# Patient Record
Sex: Female | Born: 1960
Health system: Southern US, Community
[De-identification: ages and names within clinical notes are randomized; demographics above are authoritative.]

## PROBLEM LIST (undated history)

## (undated) DIAGNOSIS — R7303 Prediabetes: Secondary | ICD-10-CM

## (undated) DIAGNOSIS — N200 Calculus of kidney: Secondary | ICD-10-CM

## (undated) DIAGNOSIS — N838 Other noninflammatory disorders of ovary, fallopian tube and broad ligament: Secondary | ICD-10-CM

## (undated) DIAGNOSIS — C801 Malignant (primary) neoplasm, unspecified: Secondary | ICD-10-CM

## (undated) HISTORY — PX: APPENDECTOMY: SHX54

## (undated) HISTORY — DX: Prediabetes: R73.03

## (undated) HISTORY — DX: Other noninflammatory disorders of ovary, fallopian tube and broad ligament: N83.8

---

## 1994-07-04 HISTORY — PX: BRAIN SURGERY: SHX531

## 2004-08-24 ENCOUNTER — Emergency Department: Payer: Self-pay | Admitting: Emergency Medicine

## 2010-01-12 ENCOUNTER — Emergency Department (HOSPITAL_COMMUNITY): Admission: EM | Admit: 2010-01-12 | Discharge: 2010-01-12 | Payer: Self-pay | Admitting: Family Medicine

## 2010-01-16 ENCOUNTER — Emergency Department (HOSPITAL_COMMUNITY): Admission: EM | Admit: 2010-01-16 | Discharge: 2010-01-16 | Payer: Self-pay | Admitting: Emergency Medicine

## 2010-03-22 ENCOUNTER — Encounter: Admission: RE | Admit: 2010-03-22 | Discharge: 2010-03-22 | Payer: Self-pay | Admitting: Obstetrics & Gynecology

## 2010-09-18 LAB — URINALYSIS, ROUTINE W REFLEX MICROSCOPIC
Ketones, ur: 15 mg/dL — AB
Protein, ur: NEGATIVE mg/dL
pH: 5 (ref 5.0–8.0)

## 2010-09-19 LAB — POCT I-STAT, CHEM 8
Glucose, Bld: 120 mg/dL — ABNORMAL HIGH (ref 70–99)
Potassium: 4.9 mEq/L (ref 3.5–5.1)
Sodium: 139 mEq/L (ref 135–145)
TCO2: 26 mmol/L (ref 0–100)

## 2010-09-19 LAB — POCT URINALYSIS DIP (DEVICE)
Bilirubin Urine: NEGATIVE
Ketones, ur: NEGATIVE mg/dL
Nitrite: NEGATIVE
Specific Gravity, Urine: >=1.03 (ref 1.005–1.030)

## 2011-08-24 ENCOUNTER — Encounter: Payer: Self-pay | Admitting: *Deleted

## 2011-08-24 ENCOUNTER — Other Ambulatory Visit: Payer: Self-pay | Admitting: Obstetrics and Gynecology

## 2011-08-24 DIAGNOSIS — N838 Other noninflammatory disorders of ovary, fallopian tube and broad ligament: Secondary | ICD-10-CM

## 2011-08-25 ENCOUNTER — Ambulatory Visit
Admission: RE | Admit: 2011-08-25 | Discharge: 2011-08-25 | Disposition: A | Discharge status: Home / Self Care | Payer: 59 | Source: Ambulatory Visit | Attending: Obstetrics and Gynecology | Admitting: Obstetrics and Gynecology

## 2011-08-25 ENCOUNTER — Ambulatory Visit: Payer: 59 | Attending: Gynecologic Oncology | Admitting: Gynecologic Oncology

## 2011-08-25 ENCOUNTER — Encounter: Payer: Self-pay | Admitting: Gynecologic Oncology

## 2011-08-25 VITALS — BP 108/62 | HR 78 | Temp 97.9°F | Resp 18 | Ht 66.77 in | Wt 159.2 lb

## 2011-08-25 DIAGNOSIS — Z9089 Acquired absence of other organs: Secondary | ICD-10-CM | POA: Insufficient documentation

## 2011-08-25 DIAGNOSIS — Z885 Allergy status to narcotic agent status: Secondary | ICD-10-CM | POA: Insufficient documentation

## 2011-08-25 DIAGNOSIS — N9489 Other specified conditions associated with female genital organs and menstrual cycle: Secondary | ICD-10-CM | POA: Insufficient documentation

## 2011-08-25 DIAGNOSIS — N838 Other noninflammatory disorders of ovary, fallopian tube and broad ligament: Secondary | ICD-10-CM

## 2011-08-25 DIAGNOSIS — Z803 Family history of malignant neoplasm of breast: Secondary | ICD-10-CM | POA: Insufficient documentation

## 2011-08-25 MED ORDER — IOHEXOL 300 MG/ML  SOLN
100.0000 mL | Freq: Once | INTRAMUSCULAR | Status: AC | PRN
  Administered 2011-08-25: 100 mL via INTRAVENOUS

## 2011-08-25 NOTE — Patient Instructions (Signed)
preop pending.

## 2011-08-25 NOTE — Progress Notes (Signed)
Consult Note: Gyn-Onc  Jordan Golden 51 y.o. female  CC:  Chief Complaint  Patient presents with  . Ovarian Mass    New patient    HPI: Patient is seen today in consultation at the request of Dr. Tresa Res.  She is a very pleasant 51 year old gravida 4 para 4.  Her last menstrual cycle was in 2005. She had extensive workup for early menopause which was negative. She took Hormone Replacement therapy for 1 month has not taken any since.   She states about 2 weeks ago she was lying in bed she felt a "mobile egg". Last week she felt like it increased in size and was harder, and nonmobile. There was no pain associated with this whatsoever. She was seen in Dr. Harlene Salts office on February 20. Ultrasound revealed a 10.2 x 3.1 x 4.7 cm uterus with endometrial stripe of 2.4 mm. The uterus was displaced by a 10 x 9.6 crit 15.0 cm complex septated cystic mass. There is an irregularly shaped vascular area seen within the upper third of the lumen of this mass which measured 5.3 x 4.2 x 3.5 cm. Neither adnexa was distinctly identified. There is no free fluid present. She had a CA 125 that was 15.2. She had a CT scan of the abdomen and pelvis with contrast this morning. Lung bases and upper abdomen are normal. There was no adenopathy noted. There was a large mass within the mid pelvis largely cystic, but the internal soft tissue components. The mass measures 16.0 x 9.7 x 12.0 cm. The mass displaced the uterus slightly to the right recent question of it arising from the left adnexa. Is a multi-fibroid uterus present. There is no free fluid. The colon and small bowel were normal.  Interval History:   Review of Systems: She denies any pain, early satiety, nausea, vomiting or change in her bowel or bladder habits. She denies any vasomotor symptoms. 10 point review of systems is negative  Current Meds:  Outpatient Encounter Prescriptions as of 08/25/2011  Medication Sig Dispense Refill  . CALCIUM-VITAMIN D PO Take  1 tablet by mouth daily.      . Multiple Vitamins-Minerals (MULTIVITAMIN WITH MINERALS) tablet Take 1 tablet by mouth daily.      Marland Kitchen VITAMIN D, CHOLECALCIFEROL, PO Take 1 tablet by mouth once a week.       Facility-Administered Encounter Medications as of 08/25/2011  Medication Dose Route Frequency Provider Last Rate Last Dose  . iohexol (OMNIPAQUE) 300 MG/ML solution 100 mL  100 mL Intravenous Once PRN Medication Radiologist, MD   100 mL at 08/25/11 0846    Allergy:  Allergies  Allergen Reactions  . Ultram (Tramadol Hcl) Nausea And Vomiting    "Long time ago", "passed out and threw up"    Social Hx:  She has 4 children a son age 53, a daughter age 82, a son age 49, a daughter age 63 History   Social History  . Marital Status: Married    Spouse Name: N/A    Number of Children: N/A  . Years of Education: N/A   Occupational History  . Not on file.   Social History Main Topics  . Smoking status: Never Smoker   . Smokeless tobacco: Not on file  . Alcohol Use: No  . Drug Use: No  . Sexually Active: Not on file   Other Topics Concern  . Not on file   Social History Narrative  . No narrative on file    Past Surgical  Hx:  Past Surgical History  Procedure Date  . Brain surgery 1996    Due to brain injury  . Cesarean section 1995  . Appendectomy     Past Medical Hx: She is due for a mammogram. Her Pap smear in June of this past year that was negative Past Medical History  Diagnosis Date  . Ovarian mass     Family Hx: Her mother was diagnosed with breast cancer at the age of 89 and died at age of 83. She did not have any genetic testing. Her father died of heart disease at the age of 21. Has a sister who died of juvenile diabetes at the age of 66. Family History  Problem Relation Age of Onset  . Breast cancer Mother   . Heart disease Father   . Diabetes Sister     Vitals:  Blood pressure 108/62, pulse 78, temperature 97.9 F (36.6 C), temperature source Oral,  resp. rate 18, height 5' 6.77" (1.696 m), weight 159 lb 3.2 oz (72.213 kg).  Physical Exam: Well-nourished well-developed female in no acute distress.  Neck: Supple no lymphadenopathy no thyromegaly.  Lungs: Clear to auscultation bilaterally.  Cardiovascular: Regular rate and rhythm.  Abdomen: Soft, there is a reducible umbilical hernia. There is a well-healed supraumbilical incision. She has a well healed low transverse incision. There is a 16 cm mass palpable in the lower abdomen. Is nontender. There is no fluid wave. The mass is mobile.  Groins: No lymphadenopathy.  Extremities: No edema, 2+ distal pulses.  Pelvic: Normal external female genitalia. The cervix is palpably normal. There was a large partially 18 cm masses freely mobile encompassing the adnexa. Difficult to ascertain in the left or right adnexa. The uterus is not markedly enlarged. There is no nodularity.  Assessment/Plan: 51 year old pleasant female with a complex 16 cm adnexal mass arising in the setting of no evidence of extraovarian disease, a normal CA 125, and a significant family history for mother with breast cancer at the age of 55. I recommended surgical removal. She was offered several dates including March 1 at [facility], March 5 in [place], and March 8 at [facility]. Due to family obligations she believes that she may have to defer her surgery March 19 in [place]. She will speak with her family determine if other arrangements can be made she is to be taking care of her 10 year old great niece.  We discussed removal of the enlarged adnexa. We'll be sent for frozen section. We also discussed whether or not she would like to have her contralateral ovary removed as well as the uterus. In the setting of being menopausal since 2005 I would at least remove the contralateral ovary if she did not wish to have a hysterectomy. She and I discussed this. If this is a malignant process she understands hysterectomy with  removal of the contralateral ovary inappropriate staging would need to be performed. Should this be a malignancy, I would recommend genetic testing. The risks and benefits of surgery were discussed with the patient. Her questions were elicited in answer to her satisfaction. She has my business card as well as that of Telford Nab.  She will speak with her family tonight and let us know what she would like to do in terms of scheduling her surgery.  Minka Knight A., MD 08/25/2011, 3:52 PM

## 2011-09-06 ENCOUNTER — Encounter: Payer: Self-pay | Admitting: Gynecologic Oncology

## 2011-09-06 ENCOUNTER — Ambulatory Visit: Payer: 59 | Attending: Gynecologic Oncology | Admitting: Gynecologic Oncology

## 2011-09-06 VITALS — BP 100/62 | HR 72 | Temp 98.2°F | Resp 16 | Ht 66.0 in | Wt 156.9 lb

## 2011-09-06 DIAGNOSIS — N838 Other noninflammatory disorders of ovary, fallopian tube and broad ligament: Secondary | ICD-10-CM

## 2011-09-06 DIAGNOSIS — N9489 Other specified conditions associated with female genital organs and menstrual cycle: Secondary | ICD-10-CM | POA: Insufficient documentation

## 2011-09-06 DIAGNOSIS — Z803 Family history of malignant neoplasm of breast: Secondary | ICD-10-CM | POA: Insufficient documentation

## 2011-09-06 NOTE — Progress Notes (Signed)
Pre Op Note: Gyn-Onc  Jordan Golden 51 y.o. female  CC:  Chief Complaint  Patient presents with  . Ovarian mass    Follow up    HPI: This is a  51 year old gravida 4 para 4.  Her last menstrual cycle was in 2005. She had extensive workup for early menopause which was negative. She took Hormone Replacement therapy for 1 month has not taken any since.   She states about 2 weeks ago she was lying in bed she felt a "mobile egg". Last week she felt like it increased in size and was harder, and nonmobile. There was no pain associated with this whatsoever. She was seen in Dr. Harlene Salts office on February 20. Ultrasound revealed a 10.2 x 3.1 x 4.7 cm uterus with endometrial stripe of 2.4 mm. The uterus was displaced by a 10 x 9.6 crit 15.0 cm complex septated cystic mass. There is an irregularly shaped vascular area seen within the upper third of the lumen of this mass which measured 5.3 x 4.2 x 3.5 cm. Neither adnexa was distinctly identified. There is no free fluid present. She had a CA 125 that was 15.2. She had a CT scan of the abdomen and pelvis with contrast this morning. Lung bases and upper abdomen are normal. There was no adenopathy noted. There was a large mass within the mid pelvis largely cystic, but the internal soft tissue components. The mass measures 16.0 x 9.7 x 12.0 cm. The mass displaced the uterus slightly to the right recent question of it arising from the left adnexa. Is a multi-fibroid uterus present. There is no free fluid. The colon and small bowel were normal.  Interval History: The patient is consider surgical options and today presents to share with her as her surgical preferences.  Review of Systems: She denies any pain, early satiety, nausea, vomiting or change in her bowel or bladder habits. She denies any vasomotor symptoms. 10 point review of systems is negative.  Patient is worried  Current Meds:  Outpatient Encounter Prescriptions as of 09/06/2011  Medication Sig  Dispense Refill  . CALCIUM-VITAMIN D PO Take 1 tablet by mouth daily.      . Multiple Vitamins-Minerals (MULTIVITAMIN WITH MINERALS) tablet Take 1 tablet by mouth daily.      Marland Kitchen VITAMIN D, CHOLECALCIFEROL, PO Take 1 tablet by mouth once a week.        Allergy:  Allergies  Allergen Reactions  . Ultram (Tramadol Hcl) Nausea And Vomiting    "Long time ago", "passed out and threw up"    Social Hx:  She has 4 children a son age 69, a daughter age 68, a son age 83, a daughter age 58 History   Social History  . Marital Status: Married    Spouse Name: N/A    Number of Children: N/A  . Years of Education: N/A   Occupational History  . Not on file.   Social History Main Topics  . Smoking status: Never Smoker   . Smokeless tobacco: Not on file  . Alcohol Use: No  . Drug Use: No  . Sexually Active: Not on file   Other Topics Concern  . Not on file   Social History Narrative  . No narrative on file    Past Surgical Hx:  Past Surgical History  Procedure Date  . Brain surgery 1996    Due to brain injury  . Cesarean section 1995  . Appendectomy     Past Medical Hx: She  is due for a mammogram. Her Pap smear in June of this past year that was negative Past Medical History  Diagnosis Date  . Ovarian mass     Family Hx: Her mother was diagnosed with breast cancer at the age of 82 and died at age of 74. She did not have any genetic testing. Her father died of heart disease at the age of 31. Has a sister who died of juvenile diabetes at the age of 10. Family History  Problem Relation Age of Onset  . Breast cancer Mother   . Heart disease Father   . Diabetes Sister     Vitals:  Blood pressure 100/62, pulse 72, temperature 98.2 F (36.8 C), resp. rate 16, height 5\' 6"  (1.676 m), weight 156 lb 14.4 oz (71.169 kg).  Physical Exam: Well-nourished well-developed female in no acute distress.  Neck: Supple no lymphadenopathy no thyromegaly.  Lungs: Clear to auscultation  bilaterally.  Cardiovascular: Regular rate and rhythm.  Abdomen: Soft, there is a reducible umbilical hernia. There is a well-healed supraumbilical incision. She has a well healed low transverse incision. There is a 16 cm mass palpable in the lower abdomen. Is nontender. There is no fluid wave. The mass is mobile.  Groins: No lymphadenopathy.  Extremities: No edema, 2+ distal pulses.  Pelvic: Normal external female genitalia. The cervix is palpably normal. There was a large partially 18 cm masses freely mobile encompassing the adnexa. Difficult to ascertain in the left or right adnexa. The uterus is not markedly enlarged. There is no nodularity.  Assessment/Plan: 51 year old pleasant female with a complex 16 cm adnexal mass arising in the setting of no evidence of extraovarian disease, a normal CA 125, and a significant family history for mother with breast cancer at the age of 55. The plan is to proceed with a total abdominal hysterectomy and bilateral salpingo-oophorectomy. The 16 cm adnexal mass will be  sent for frozen section.  If malignancy is identified the plan is to proceed with surgical staging and debulking. The questions of the patient and her husband were answered to their satisfaction. Then decide risks of the procedure include but are not limited to infection bleeding damage to surrounding structures on auscultation reoperation. The planned surgical date for the procedure is 09/20/2011.  Laurette Schimke, MD 09/06/2011, 2:25 PM

## 2011-09-06 NOTE — Patient Instructions (Signed)
TAH BSO 09/20/2011

## 2011-09-10 ENCOUNTER — Encounter (HOSPITAL_COMMUNITY): Payer: Self-pay | Admitting: Nurse Practitioner

## 2011-09-10 ENCOUNTER — Emergency Department (HOSPITAL_COMMUNITY): Payer: 59

## 2011-09-10 ENCOUNTER — Emergency Department (HOSPITAL_COMMUNITY)
Admission: EM | Admit: 2011-09-10 | Discharge: 2011-09-10 | Disposition: A | Discharge status: Home / Self Care | Payer: 59 | Attending: Emergency Medicine | Admitting: Emergency Medicine

## 2011-09-10 DIAGNOSIS — M549 Dorsalgia, unspecified: Secondary | ICD-10-CM | POA: Insufficient documentation

## 2011-09-10 DIAGNOSIS — W108XXA Fall (on) (from) other stairs and steps, initial encounter: Secondary | ICD-10-CM | POA: Insufficient documentation

## 2011-09-10 MED ORDER — IBUPROFEN 800 MG PO TABS: 800.0000 mg | ORAL_TABLET | Freq: Three times a day (TID) | ORAL | Status: AC

## 2011-09-10 MED ORDER — DIAZEPAM 5 MG PO TABS: 5.0000 mg | ORAL_TABLET | Freq: Every day | ORAL | Status: DC

## 2011-09-10 NOTE — ED Notes (Signed)
States fell down stairs at home on Tuesday night, landing on L lower back/flank, continued pain since. Ambulatory, mae

## 2011-09-10 NOTE — Discharge Instructions (Signed)
Back Pain, Adult Low back pain is very common. About 1 in 5 people have back pain.The cause of low back pain is rarely dangerous. The pain often gets better over time.About half of people with a sudden onset of back pain feel better in just 2 weeks. About 8 in 10 people feel better by 6 weeks.  CAUSES Some common causes of back pain include:  Strain of the muscles or ligaments supporting the spine.   Wear and tear (degeneration) of the spinal discs.   Arthritis.   Direct injury to the back.  DIAGNOSIS Most of the time, the direct cause of low back pain is not known.However, back pain can be treated effectively even when the exact cause of the pain is unknown.Answering your caregiver's questions about your overall health and symptoms is one of the most accurate ways to make sure the cause of your pain is not dangerous. If your caregiver needs more information, he or she may order lab work or imaging tests (X-rays or MRIs).However, even if imaging tests show changes in your back, this usually does not require surgery. HOME CARE INSTRUCTIONS For many people, back pain returns.Since low back pain is rarely dangerous, it is often a condition that people can learn to manageon their own.   Remain active. It is stressful on the back to sit or stand in one place. Do not sit, drive, or stand in one place for more than 30 minutes at a time. Take short walks on level surfaces as soon as pain allows.Try to increase the length of time you walk each day.   Do not stay in bed.Resting more than 1 or 2 days can delay your recovery.   Do not avoid exercise or work.Your body is made to move.It is not dangerous to be active, even though your back may hurt.Your back will likely heal faster if you return to being active before your pain is gone.   Pay attention to your body when you bend and lift. Many people have less discomfortwhen lifting if they bend their knees, keep the load close to their  bodies,and avoid twisting. Often, the most comfortable positions are those that put less stress on your recovering back.   Find a comfortable position to sleep. Use a firm mattress and lie on your side with your knees slightly bent. If you lie on your back, put a pillow under your knees.   Only take over-the-counter or prescription medicines as directed by your caregiver. Over-the-counter medicines to reduce pain and inflammation are often the most helpful.Your caregiver may prescribe muscle relaxant drugs.These medicines help dull your pain so you can more quickly return to your normal activities and healthy exercise.   Put ice on the injured area.   Put ice in a plastic bag.   Place a towel between your skin and the bag.   Leave the ice on for 15 to 20 minutes, 3 to 4 times a day for the first 2 to 3 days. After that, ice and heat may be alternated to reduce pain and spasms.   Ask your caregiver about trying back exercises and gentle massage. This may be of some benefit.   Avoid feeling anxious or stressed.Stress increases muscle tension and can worsen back pain.It is important to recognize when you are anxious or stressed and learn ways to manage it.Exercise is a great option.  SEEK MEDICAL CARE IF:  You have pain that is not relieved with rest or medicine.   You have   pain that does not improve in 1 week.   You have new symptoms.   You are generally not feeling well.  SEEK IMMEDIATE MEDICAL CARE IF:   You have pain that radiates from your back into your legs.   You develop new bowel or bladder control problems.   You have unusual weakness or numbness in your arms or legs.   You develop nausea or vomiting.   You develop abdominal pain.   You feel faint.  Document Released: 06/20/2005 Document Revised: 06/09/2011 Document Reviewed: 11/08/2010 [employer] Patient Information 2012 [employer]. 

## 2011-09-10 NOTE — ED Provider Notes (Signed)
History     CSN: [id number]  Arrival date & time 09/10/11  1234   First MD Initiated Contact with Patient 09/10/11 1415      Chief Complaint  Patient presents with  . Fall    (Consider location/radiation/quality/duration/timing/severity/associated sxs/prior treatment) HPI Patient presents with pain about her left inferior back.  She fell at the bottom of a flight of stairs, striking her lower back against the steps.  The accident occurred 5 days ago.  Since onset the pain has been focal, worsening, worse with motion, minimally improved with NSAIDs.  The pain is sharp.  No bowel or bladder changes, no distal extremity dysesthesia or weakness. Past Medical History  Diagnosis Date  . Ovarian mass     Past Surgical History  Procedure Date  . Brain surgery 1996    Due to brain injury  . Cesarean section 1995  . Appendectomy     Family History  Problem Relation Age of Onset  . Breast cancer Mother   . Heart disease Father   . Diabetes Sister     History  Substance Use Topics  . Smoking status: Never Smoker   . Smokeless tobacco: Not on file  . Alcohol Use: No    OB History    Grav Para Term Preterm Abortions TAB SAB Ect Mult Living                  Review of Systems  All other systems reviewed and are negative.    Allergies  Ultram  Home Medications   Current Outpatient Rx  Name Route Sig Dispense Refill  . CALCIUM PO Oral Take 1 tablet by mouth daily.    . MULTI-VITAMIN/MINERALS PO TABS Oral Take 1 tablet by mouth daily.    Marland Kitchen VITAMIN D (CHOLECALCIFEROL) PO Oral Take 1 tablet by mouth once a week. Fridays      BP 117/73  Pulse 72  Temp(Src) 97 F (36.1 C) (Oral)  Resp 18  Ht 5\' 7"  (1.702 m)  Wt 157 lb (71.215 kg)  BMI 24.59 kg/m2  SpO2 100%  Physical Exam  Nursing note and vitals reviewed. Constitutional: She appears well-developed and well-nourished.  HENT:  Head: Normocephalic and atraumatic.  Eyes: Conjunctivae and EOM are normal.    Cardiovascular: Normal rate and normal heart sounds.   Pulmonary/Chest: Effort normal and breath sounds normal. No stridor.  Musculoskeletal:       Arms:   ED Course  Procedures (including critical care time)  Labs Reviewed - No data to display No results found.   No diagnosis found.  X-rays reviewed by me no acute fracture  MDM  This patient presents with persistent pain since a fall several days ago.  On exam she is in no distress with no appreciable deformity nor any distal extremity deficits.  Patient's vital signs are unremarkable.  The patient's x-ray does not demonstrate acute fracture.  I discussed the utility of different pain control measures with the patient.  She was discharged in stable condition with prescriptions for analgesia       Gerhard Munch, MD 09/10/11 1452

## 2011-09-13 ENCOUNTER — Encounter (HOSPITAL_COMMUNITY): Payer: Self-pay | Admitting: Pharmacy Technician

## 2011-09-16 ENCOUNTER — Encounter (HOSPITAL_COMMUNITY)
Admission: RE | Admit: 2011-09-16 | Discharge: 2011-09-16 | Disposition: A | Discharge status: Home / Self Care | Payer: 59 | Source: Ambulatory Visit | Attending: Obstetrics & Gynecology | Admitting: Obstetrics & Gynecology

## 2011-09-16 ENCOUNTER — Encounter (HOSPITAL_COMMUNITY): Payer: Self-pay

## 2011-09-16 HISTORY — DX: Calculus of kidney: N20.0

## 2011-09-16 LAB — DIFFERENTIAL
Lymphocytes Relative: 32 % (ref 12–46)
Lymphs Abs: 1.8 10*3/uL (ref 0.7–4.0)
Monocytes Absolute: 0.4 10*3/uL (ref 0.1–1.0)
Monocytes Relative: 7 % (ref 3–12)
Neutro Abs: 3.2 10*3/uL (ref 1.7–7.7)
Neutrophils Relative %: 57 % (ref 43–77)

## 2011-09-16 LAB — COMPREHENSIVE METABOLIC PANEL
AST: 17 U/L (ref 0–37)
Albumin: 4.1 g/dL (ref 3.5–5.2)
BUN: 20 mg/dL (ref 6–23)
Chloride: 104 mEq/L (ref 96–112)
Creatinine, Ser: 0.73 mg/dL (ref 0.50–1.10)
Total Bilirubin: 0.2 mg/dL — ABNORMAL LOW (ref 0.3–1.2)
Total Protein: 6.6 g/dL (ref 6.0–8.3)

## 2011-09-16 LAB — CBC
MCHC: 34.3 g/dL (ref 30.0–36.0)
MCV: 89.6 fL (ref 78.0–100.0)
Platelets: 292 10*3/uL (ref 150–400)
RDW: 13.7 % (ref 11.5–15.5)
WBC: 5.6 10*3/uL (ref 4.0–10.5)

## 2011-09-16 LAB — SURGICAL PCR SCREEN: Staphylococcus aureus: NEGATIVE

## 2011-09-16 NOTE — Patient Instructions (Addendum)
20 Jordan Golden  09/16/2011   Your procedure is scheduled on:  09/20/11  Report to SHORT STAY DEPT  at 8:30 AM.  Call this number if you have problems the morning of surgery: [phone]   Remember:   Do not eat food or drink liquids AFTER MIDNIGHT  May have clear liquids UNTIL 6 HOURS BEFORE SURGERY  Clear liquids include soda, tea, black coffee, apple or grape juice, broth.  Take these medicines the morning of surgery with A SIP OF WATER: NONE   Do not wear jewelry, make-up or nail polish.  Do not wear lotions, powders, or perfumes.   Do not shave legs or underarms 48 hrs. before surgery (men may shave face)  Do not bring valuables to the hospital.  Contacts, dentures or bridgework may not be worn into surgery.  Leave suitcase in the car. After surgery it may be brought to your room.  For patients admitted to the hospital, checkout time is 11:00 AM the day of discharge.   Patients discharged the day of surgery will not be allowed to drive home.  Name and phone number of your driver:   Special Instructions:   Please read over the following fact sheets that you were given: MRSA  Information / Incdentive Spirometer               SHOWER WITH BETASEPT THE NIGHT BEFORE SURGERY AND THE MORNING OF SURGERY

## 2011-09-16 NOTE — Pre-Procedure Instructions (Signed)
UA PREG NOT DONE - PT STATES HAS BEEN POSTMENAPAUSAL  X 8 YRS.

## 2011-09-19 NOTE — Pre-Procedure Instructions (Signed)
PT NOTIFIED SURGERY TIME HAS MOVED UP TO 7:15 AM AND SHE SHOULD ARRIVE TO SHORT STAY BY 5:15 AM--REMINDED NPO AFTER MIDNIGHT.

## 2011-09-20 ENCOUNTER — Ambulatory Visit (HOSPITAL_COMMUNITY): Payer: 59 | Admitting: Certified Registered Nurse Anesthetist

## 2011-09-20 ENCOUNTER — Encounter (HOSPITAL_COMMUNITY)
Admission: RE | Disposition: A | Discharge status: Home / Self Care | Payer: Self-pay | Source: Ambulatory Visit | Attending: Obstetrics & Gynecology

## 2011-09-20 ENCOUNTER — Encounter (HOSPITAL_COMMUNITY): Payer: Self-pay | Admitting: Certified Registered Nurse Anesthetist

## 2011-09-20 ENCOUNTER — Inpatient Hospital Stay (HOSPITAL_COMMUNITY)
Admission: RE | Admit: 2011-09-20 | Discharge: 2011-09-22 | DRG: 738 | Disposition: A | Discharge status: Home / Self Care | Payer: 59 | Source: Ambulatory Visit | Attending: Obstetrics & Gynecology | Admitting: Obstetrics & Gynecology

## 2011-09-20 ENCOUNTER — Encounter (HOSPITAL_COMMUNITY): Payer: Self-pay

## 2011-09-20 DIAGNOSIS — C569 Malignant neoplasm of unspecified ovary: Principal | ICD-10-CM | POA: Diagnosis present

## 2011-09-20 DIAGNOSIS — N838 Other noninflammatory disorders of ovary, fallopian tube and broad ligament: Secondary | ICD-10-CM

## 2011-09-20 DIAGNOSIS — Z01812 Encounter for preprocedural laboratory examination: Secondary | ICD-10-CM

## 2011-09-20 HISTORY — PX: LAPAROTOMY: SHX154

## 2011-09-20 HISTORY — PX: SALPINGOOPHORECTOMY: SHX82

## 2011-09-20 HISTORY — PX: ABDOMINAL HYSTERECTOMY: SHX81

## 2011-09-20 LAB — TYPE AND SCREEN: Antibody Screen: NEGATIVE

## 2011-09-20 LAB — ABO/RH: ABO/RH(D): O POS

## 2011-09-20 SURGERY — LAPAROTOMY, EXPLORATORY
Anesthesia: General | Wound class: Clean Contaminated

## 2011-09-20 MED ORDER — KETOROLAC TROMETHAMINE 30 MG/ML IJ SOLN
INTRAMUSCULAR | Status: AC
  Filled 2011-09-20: qty 1

## 2011-09-20 MED ORDER — KCL IN DEXTROSE-NACL 20-5-0.45 MEQ/L-%-% IV SOLN
INTRAVENOUS | Status: DC
  Administered 2011-09-20 (×2): via INTRAVENOUS
  Administered 2011-09-20: 1000 mL via INTRAVENOUS
  Administered 2011-09-21: 02:00:00 via INTRAVENOUS
  Filled 2011-09-20 (×5): qty 1000

## 2011-09-20 MED ORDER — KCL IN DEXTROSE-NACL 20-5-0.45 MEQ/L-%-% IV SOLN
INTRAVENOUS | Status: AC
  Administered 2011-09-20: 1000 mL via INTRAVENOUS
  Filled 2011-09-20: qty 1000

## 2011-09-20 MED ORDER — NALOXONE HCL 0.4 MG/ML IJ SOLN: 0.4000 mg | INTRAMUSCULAR | Status: DC | PRN

## 2011-09-20 MED ORDER — HYDROMORPHONE 0.3 MG/ML IV SOLN
INTRAVENOUS | Status: DC
  Administered 2011-09-20: 0.6 mg via INTRAVENOUS
  Administered 2011-09-20: 0.3 mg via INTRAVENOUS
  Administered 2011-09-21 (×2): 1.2 mg via INTRAVENOUS
  Administered 2011-09-21: 0.3 mg via INTRAVENOUS

## 2011-09-20 MED ORDER — FENTANYL CITRATE 0.05 MG/ML IJ SOLN
INTRAMUSCULAR | Status: DC | PRN
  Administered 2011-09-20 (×2): 50 ug via INTRAVENOUS
  Administered 2011-09-20: 100 ug via INTRAVENOUS
  Administered 2011-09-20: 150 ug via INTRAVENOUS

## 2011-09-20 MED ORDER — ACETAMINOPHEN 10 MG/ML IV SOLN
INTRAVENOUS | Status: DC | PRN
  Administered 2011-09-20: 1000 mg via INTRAVENOUS

## 2011-09-20 MED ORDER — KETOROLAC TROMETHAMINE 30 MG/ML IJ SOLN
30.0000 mg | Freq: Four times a day (QID) | INTRAMUSCULAR | Status: AC
  Administered 2011-09-20 (×3): 30 mg via INTRAVENOUS
  Filled 2011-09-20 (×2): qty 1

## 2011-09-20 MED ORDER — HEPARIN SODIUM (PORCINE) 1000 UNIT/ML IJ SOLN
INTRAMUSCULAR | Status: DC | PRN
  Administered 2011-09-20: 1000 [IU] via INTRAVENOUS

## 2011-09-20 MED ORDER — DIPHENHYDRAMINE HCL 50 MG/ML IJ SOLN: 12.5000 mg | Freq: Four times a day (QID) | INTRAMUSCULAR | Status: DC | PRN

## 2011-09-20 MED ORDER — ONDANSETRON HCL 4 MG/2ML IJ SOLN: 4.0000 mg | Freq: Four times a day (QID) | INTRAMUSCULAR | Status: DC | PRN

## 2011-09-20 MED ORDER — HEPARIN SODIUM (PORCINE) 1000 UNIT/ML IJ SOLN
INTRAMUSCULAR | Status: AC
  Filled 2011-09-20: qty 1

## 2011-09-20 MED ORDER — MAGNESIUM HYDROXIDE 400 MG/5ML PO SUSP
30.0000 mL | Freq: Three times a day (TID) | ORAL | Status: AC
  Administered 2011-09-20 – 2011-09-21 (×3): 30 mL via ORAL
  Filled 2011-09-20 (×3): qty 30

## 2011-09-20 MED ORDER — STERILE WATER FOR IRRIGATION IR SOLN
Status: DC | PRN
  Administered 2011-09-20: 1500 mL

## 2011-09-20 MED ORDER — ONDANSETRON HCL 4 MG/2ML IJ SOLN
INTRAMUSCULAR | Status: DC | PRN
  Administered 2011-09-20: 4 mg via INTRAVENOUS

## 2011-09-20 MED ORDER — GLYCOPYRROLATE 0.2 MG/ML IJ SOLN
INTRAMUSCULAR | Status: DC | PRN
  Administered 2011-09-20: .5 mg via INTRAVENOUS

## 2011-09-20 MED ORDER — NEOSTIGMINE METHYLSULFATE 1 MG/ML IJ SOLN
INTRAMUSCULAR | Status: DC | PRN
  Administered 2011-09-20: 2.5 mg via INTRAVENOUS

## 2011-09-20 MED ORDER — HYDROMORPHONE HCL PF 1 MG/ML IJ SOLN
0.2500 mg | INTRAMUSCULAR | Status: DC | PRN
  Administered 2011-09-20 (×2): 0.5 mg via INTRAVENOUS

## 2011-09-20 MED ORDER — ZOLPIDEM TARTRATE 5 MG PO TABS: 5.0000 mg | ORAL_TABLET | Freq: Every evening | ORAL | Status: DC | PRN

## 2011-09-20 MED ORDER — KETOROLAC TROMETHAMINE 30 MG/ML IJ SOLN: 30.0000 mg | Freq: Four times a day (QID) | INTRAMUSCULAR | Status: AC

## 2011-09-20 MED ORDER — LACTATED RINGERS IV SOLN
INTRAVENOUS | Status: DC
  Administered 2011-09-20 (×3): via INTRAVENOUS

## 2011-09-20 MED ORDER — EPHEDRINE SULFATE 50 MG/ML IJ SOLN
INTRAMUSCULAR | Status: DC | PRN
  Administered 2011-09-20 (×2): 5 mg via INTRAVENOUS

## 2011-09-20 MED ORDER — OXYCODONE-ACETAMINOPHEN 5-325 MG PO TABS
1.0000 | ORAL_TABLET | ORAL | Status: DC | PRN
  Administered 2011-09-21: 1 via ORAL
  Administered 2011-09-21: 2 via ORAL
  Administered 2011-09-21: 1 via ORAL
  Administered 2011-09-21 – 2011-09-22 (×4): 2 via ORAL
  Filled 2011-09-20: qty 1
  Filled 2011-09-20 (×5): qty 2
  Filled 2011-09-20: qty 1

## 2011-09-20 MED ORDER — CISATRACURIUM BESYLATE 2 MG/ML IV SOLN
INTRAVENOUS | Status: DC | PRN
  Administered 2011-09-20 (×2): 2 mg via INTRAVENOUS
  Administered 2011-09-20: 6 mg via INTRAVENOUS

## 2011-09-20 MED ORDER — MIDAZOLAM HCL 5 MG/5ML IJ SOLN
INTRAMUSCULAR | Status: DC | PRN
  Administered 2011-09-20 (×2): 1 mg via INTRAVENOUS

## 2011-09-20 MED ORDER — SUCCINYLCHOLINE CHLORIDE 20 MG/ML IJ SOLN
INTRAMUSCULAR | Status: DC | PRN
  Administered 2011-09-20: 100 mg via INTRAVENOUS

## 2011-09-20 MED ORDER — LIDOCAINE HCL 1 % IJ SOLN
INTRAMUSCULAR | Status: DC | PRN
  Administered 2011-09-20: 60 mg via INTRADERMAL

## 2011-09-20 MED ORDER — SODIUM CHLORIDE 0.9 % IJ SOLN: 9.0000 mL | INTRAMUSCULAR | Status: DC | PRN

## 2011-09-20 MED ORDER — HYDROMORPHONE HCL PF 1 MG/ML IJ SOLN
INTRAMUSCULAR | Status: AC
  Filled 2011-09-20: qty 1

## 2011-09-20 MED ORDER — PROPOFOL 10 MG/ML IV BOLUS
INTRAVENOUS | Status: DC | PRN
  Administered 2011-09-20: 120 mg via INTRAVENOUS

## 2011-09-20 MED ORDER — FENTANYL CITRATE 0.05 MG/ML IJ SOLN
INTRAMUSCULAR | Status: AC
  Filled 2011-09-20: qty 2

## 2011-09-20 MED ORDER — DIPHENHYDRAMINE HCL 12.5 MG/5ML PO ELIX: 12.5000 mg | ORAL_SOLUTION | Freq: Four times a day (QID) | ORAL | Status: DC | PRN

## 2011-09-20 MED ORDER — DEXTROSE 5 % IV SOLN
1.0000 g | INTRAVENOUS | Status: AC
  Administered 2011-09-20: 1 g via INTRAVENOUS
  Filled 2011-09-20: qty 1

## 2011-09-20 MED ORDER — HETASTARCH-ELECTROLYTES 6 % IV SOLN
INTRAVENOUS | Status: DC | PRN
  Administered 2011-09-20: 08:00:00 via INTRAVENOUS

## 2011-09-20 MED ORDER — KETOROLAC TROMETHAMINE 15 MG/ML IJ SOLN
INTRAMUSCULAR | Status: AC
  Filled 2011-09-20: qty 1

## 2011-09-20 MED ORDER — ACETAMINOPHEN 10 MG/ML IV SOLN
INTRAVENOUS | Status: AC
  Filled 2011-09-20: qty 100

## 2011-09-20 MED ORDER — ONDANSETRON HCL 4 MG PO TABS: 4.0000 mg | ORAL_TABLET | Freq: Four times a day (QID) | ORAL | Status: DC | PRN

## 2011-09-20 MED ORDER — 0.9 % SODIUM CHLORIDE (POUR BTL) OPTIME
TOPICAL | Status: DC | PRN
  Administered 2011-09-20: 2000 mL

## 2011-09-20 MED ORDER — HYDROMORPHONE 0.3 MG/ML IV SOLN
INTRAVENOUS | Status: AC
  Filled 2011-09-20: qty 25

## 2011-09-20 SURGICAL SUPPLY — 49 items
APPLIER CLIP 11 MED OPEN (CLIP) ×8
ATTRACTOMAT 16X20 MAGNETIC DRP (DRAPES) ×4 IMPLANT
BAG URINE DRAINAGE (UROLOGICAL SUPPLIES) IMPLANT
BENZOIN TINCTURE PRP APPL 2/3 (GAUZE/BANDAGES/DRESSINGS) IMPLANT
BLADE EXTENDED COATED 6.5IN (ELECTRODE) ×4 IMPLANT
CANISTER SUCTION 2500CC (MISCELLANEOUS) ×4 IMPLANT
CHLORAPREP W/TINT 26ML (MISCELLANEOUS) ×4 IMPLANT
CLIP APPLIE 11 MED OPEN (CLIP) ×6 IMPLANT
CLIP TI MEDIUM LARGE 6 (CLIP) IMPLANT
CLOTH BEACON ORANGE TIMEOUT ST (SAFETY) ×4 IMPLANT
COVER SURGICAL LIGHT HANDLE (MISCELLANEOUS) ×4 IMPLANT
DRAPE UTILITY 15X26 (DRAPE) ×4 IMPLANT
DRAPE WARM FLUID 44X44 (DRAPE) ×4 IMPLANT
DRSG TELFA 4X14 ISLAND ADH (GAUZE/BANDAGES/DRESSINGS) ×4 IMPLANT
ELECT REM PT RETURN 9FT ADLT (ELECTROSURGICAL) ×4
ELECTRODE REM PT RTRN 9FT ADLT (ELECTROSURGICAL) ×3 IMPLANT
GAUZE SPONGE 4X4 16PLY XRAY LF (GAUZE/BANDAGES/DRESSINGS) ×4 IMPLANT
GLOVE BIO SURGEON STRL SZ7.5 (GLOVE) ×20 IMPLANT
GLOVE BIOGEL M STRL SZ7.5 (GLOVE) ×12 IMPLANT
GLOVE INDICATOR 8.0 STRL GRN (GLOVE) ×4 IMPLANT
GOWN STRL REIN XL XLG (GOWN DISPOSABLE) ×4 IMPLANT
LIGASURE IMPACT 36 18CM CVD LR (INSTRUMENTS) ×4 IMPLANT
NS IRRIG 1000ML POUR BTL (IV SOLUTION) ×4 IMPLANT
PACK ABDOMINAL WL (CUSTOM PROCEDURE TRAY) ×4 IMPLANT
SHEET LAVH (DRAPES) ×4 IMPLANT
SPONGE GAUZE 4X4 12PLY (GAUZE/BANDAGES/DRESSINGS) ×4 IMPLANT
SPONGE LAP 18X18 X RAY DECT (DISPOSABLE) ×8 IMPLANT
STRIP CLOSURE SKIN 1/2X4 (GAUZE/BANDAGES/DRESSINGS) IMPLANT
SUT ETHILON 1 LR 30 (SUTURE) IMPLANT
SUT PDS AB 0 CT1 36 (SUTURE) ×8 IMPLANT
SUT PDS AB 0 CTX 60 (SUTURE) ×8 IMPLANT
SUT SILK 0 (SUTURE) ×1
SUT SILK 0 30XBRD TIE 6 (SUTURE) ×3 IMPLANT
SUT SILK 2 0 (SUTURE)
SUT SILK 2 0 30  PSL (SUTURE)
SUT SILK 2 0 30 PSL (SUTURE) IMPLANT
SUT SILK 2-0 18XBRD TIE 12 (SUTURE) IMPLANT
SUT VIC AB 0 CT1 36 (SUTURE) ×24 IMPLANT
SUT VIC AB 2-0 CT2 27 (SUTURE) IMPLANT
SUT VIC AB 2-0 SH 27 (SUTURE) ×1
SUT VIC AB 2-0 SH 27X BRD (SUTURE) ×3 IMPLANT
SUT VIC AB 3-0 CTX 36 (SUTURE) IMPLANT
SUT VIC AB 4-0 PS1 27 (SUTURE) ×8 IMPLANT
SUT VICRYL 0 TIES 12 18 (SUTURE) ×4 IMPLANT
TOWEL BLUE STERILE X RAY DET (MISCELLANEOUS) ×4 IMPLANT
TOWEL OR 17X26 10 PK STRL BLUE (TOWEL DISPOSABLE) ×4 IMPLANT
TOWEL OR NON WOVEN STRL DISP B (DISPOSABLE) ×8 IMPLANT
TRAY FOLEY CATH 14FRSI W/METER (CATHETERS) ×4 IMPLANT
WATER STERILE IRR 1500ML POUR (IV SOLUTION) ×4 IMPLANT

## 2011-09-20 NOTE — Op Note (Signed)
Preoperative Diagnosis: 1. Left adnexal mass.  Postoperative Diagnosis:Left t adnexal mass consistent with  adenocarcinoma  of the ovary on frozen section.   Procedure(s) Performed: 1. Exploratory laparotomy with total hysterectomy bilateral salpingo-oophorectomy, pelvic and para-aortic lymph node dissection, infragastric omentectomy washings and biopsies for ovarian cancer .  Surgeon: Laurette Schimke, M.D.   Assistanr Surgeon: Antionette Char, M.D. Assistant: Telford Nab RN  Specimens: Uterus Cervix, Bilateral tubes / ovaries, bilateral pelvic and para-aortic lymph nodes, peritoneal biopsies, washings and omentum.   Estimated Blood Loss: 100 mL. Blood Replacement: None.   Urine Output: 250cc  Complications: None.   Operative Findings: A mobile left ovarian mass,  16 cm mass nonadherence to any surrounding structures. No evidence of intra-abdominal pelvic or peritoneal metastases no intra-peritoneal rupture occurred;  Frozen pathology was consistent with low malignant potential ovarian cancer with a solid nodule of  ovarian adenocarcinoma; left tube and ovary normal in appearance; 3) normal bilateral pelvic and para-aortic lymph nodes and omentum; no evidence of disease on  small and large bowel palpation. This represented an optimal cytoreduction with no gross visible disease remaining. The appendix was surgically absent.  Procedure:   The patient was seen in the Holding Room. The risks, benefits, complications, treatment options, and expected outcomes were discussed with the patient.  The patient concurred with the proposed plan, giving informed consent.   The patient was  identified as Jordan Golden and the procedure verified as hysterectomy, BSO with possible staging. A Time Out was held and the above information confirmed upon entry to the operating room..  After induction of anesthesia, the patient was draped and prepped in the usual sterile manner.  She was prepped and draped in  the normal sterile fashion in the dorsal lithotomy position in padded Allen stirrups with good attention paid to support of the lower back and lower extremities. Position was adjusted for appropriate support. A Foley catheter was placed to gravity.   A midline vertical incision was made and carried through the subcutaneous tissue to the fascia. The fascial incision was made and extended superiorally. The rectus muscles were separated. The peritoneum was identified and entered. Peritoneal incision was extended longitudinally.  The abdominal cavity was entered sharply and without incident. A Bookwalter retractor was then placed. A survey of the abdomen and pelvis revealed the above findings, which were significant for a large left-sided retroperitoneal mass displacing the colon. The left tube and ovary were grossly normal in appearance.  Washings were collected.   After packing the small bowel into the upper abdomen, we began the procedure by entering the  pelvic sidewall just posterior to the left round ligament. The pararectal space was developed and the retroperitoneum developed up to the level of the common iliac artery.  The course of the ureter was identified with ease. The left P was then skeletonized, clamped, cut and suture ligated with 0 Vicryl x 2. The left utero-ovarian ligament was clamped and transected.   The entire left  tube and ovary were then sent to pathology  Kelly clamps were placed on both uterine cornua. The round ligaments were identified and transected with the bovie. The anterior peritoneal reflection was incised and the bladder was dissected off the lower uterine segment. The retroperitoneal space was explored and the ureters were identified bilaterally. The right adnexa were secured.  The right ureter was identified in the retroperitoneum.  The right IP was clamped transected and ligated.  The uterine vessels were skeletonized bilaterally , then clamped,  cut and suture ligated with  0-Vicryl suture. Serial pedicles of the cardinal and utero-sacral ligaments were clamped, cut, and suture ligated with 0-Vicryl. Entrance was made into the vagina and the cervix amputated. The vaginal cuff angle were suture ligated with 0-Vicryl suture. placed incorporating the utero-sacral ligaments for support. The vaginal cuff was then closed in the standard [facility] fashion with 0 PDS suture.   The pelvis was copiously irrigated. Hemostasis was observed.  Peritoneal biopsies of the pelvis abdomen, cul de sac, colic gutters, diaphragm were collected.  A staging procedure was then performed. We further developed the left and right paravesical and pararectal spaces and performed a left-sided pelvic lymph node dissection with the following borders: proximally the bifurcation of the common iliac, distally the circumflex iliac vein, laterally the genitofemoral nerve, the medial border was the superior vesicle artery and the deep border was the obturator nerve. All lymphatic tissue was removed and sent to Pathology. A similar procedure was performed on the contralateral side. Right and left pelvic peritoneal and anterior and posterior pelvic peritoneal biopsies were then performed with the Bovie. The right pelvic gutter was then re-explored after adjustment of the Bookwalter and the line of Toldt incised, deviating the colon medially. A right para-aortic lymph node dissection was then performed with the following borders: distally the common iliac, medially the aorta and IVC, laterally the psoas and the proximal border was just inferior to the renal vasculature. All lymphatic tissue was removed and sent to Pathology.    The patient was noted to have a surgically absent appendix and no infracolic omentum.  An infragastric omentectomy was performed using a Ligasure.  Hemostasis was assured.  The fascia was reapproximated with 0 looped PDS using a total of two sutures. The subcutaneous layer was then irrigated  copiously.  The subcutaneous layer was reapproximated with interrupted 2-0 Vicryl. The subcutaneous layer was then reapproximated with a running 4-0 Monocryl. The patient tolerated the procedure well.   Sponge, lap and needle counts were correct x 2.    The patient had sequential compression devices for VTE prophylaxis and will receive Lovenox postoperatively. The patient was extubated and taken to the recovery room in stable condition.

## 2011-09-20 NOTE — Interval H&P Note (Signed)
History and Physical Interval Note:  09/20/2011 7:02 AM  Jordan Golden  has presented today for surgery, with the diagnosis of pelvic mass  The various methods of treatment have been discussed with the patient and family. After consideration of risks, benefits and other options for treatment, the patient has consented to  Procedure(s) (LRB): EXPLORATORY LAPAROTOMY (N/A) HYSTERECTOMY ABDOMINAL (N/A)  LAPAROTOMY WITH STAGING (N/A) SALPINGO OOPHERECTOMY (Bilateral) as a surgical intervention .  The patients' history has been reviewed, patient examined, no change in status, stable for surgery.  I have reviewed the patients' chart and labs.  Questions were answered to the patient's satisfaction.     [place], [facility]

## 2011-09-20 NOTE — H&P (View-Only) (Signed)
Pre Op Note: Gyn-Onc  Jordan Golden 51 y.o. female  CC:  Chief Complaint  Patient presents with  . Ovarian mass    Follow up    HPI: This is a  51-year-old gravida 4 para 4.  Her last menstrual cycle was in 2005. She had extensive workup for early menopause which was negative. She took Hormone Replacement therapy for 1 month has not taken any since.   She states about 2 weeks ago she was lying in bed she felt a "mobile egg". Last week she felt like it increased in size and was harder, and nonmobile. There was no pain associated with this whatsoever. She was seen in Dr. Romine's office on February 20. Ultrasound revealed a 10.2 x 3.1 x 4.7 cm uterus with endometrial stripe of 2.4 mm. The uterus was displaced by a 10 x 9.6 crit 15.0 cm complex septated cystic mass. There is an irregularly shaped vascular area seen within the upper third of the lumen of this mass which measured 5.3 x 4.2 x 3.5 cm. Neither adnexa was distinctly identified. There is no free fluid present. She had a CA 125 that was 15.2. She had a CT scan of the abdomen and pelvis with contrast this morning. Lung bases and upper abdomen are normal. There was no adenopathy noted. There was a large mass within the mid pelvis largely cystic, but the internal soft tissue components. The mass measures 16.0 x 9.7 x 12.0 cm. The mass displaced the uterus slightly to the right recent question of it arising from the left adnexa. Is a multi-fibroid uterus present. There is no free fluid. The colon and small bowel were normal.  Interval History: The patient is consider surgical options and today presents to share with her as her surgical preferences.  Review of Systems: She denies any pain, early satiety, nausea, vomiting or change in her bowel or bladder habits. She denies any vasomotor symptoms. 10 point review of systems is negative.  Patient is worried  Current Meds:  Outpatient Encounter Prescriptions as of 09/06/2011  Medication Sig  Dispense Refill  . CALCIUM-VITAMIN D PO Take 1 tablet by mouth daily.      . Multiple Vitamins-Minerals (MULTIVITAMIN WITH MINERALS) tablet Take 1 tablet by mouth daily.      . VITAMIN D, CHOLECALCIFEROL, PO Take 1 tablet by mouth once a week.        Allergy:  Allergies  Allergen Reactions  . Ultram (Tramadol Hcl) Nausea And Vomiting    "Long time ago", "passed out and threw up"    Social Hx:  She has 4 children a son age 25, a daughter age 23, a son age 20, a daughter age 18 History   Social History  . Marital Status: Married    Spouse Name: N/A    Number of Children: N/A  . Years of Education: N/A   Occupational History  . Not on file.   Social History Main Topics  . Smoking status: Never Smoker   . Smokeless tobacco: Not on file  . Alcohol Use: No  . Drug Use: No  . Sexually Active: Not on file   Other Topics Concern  . Not on file   Social History Narrative  . No narrative on file    Past Surgical Hx:  Past Surgical History  Procedure Date  . Brain surgery 1996    Due to brain injury  . Cesarean section 1995  . Appendectomy     Past Medical Hx: She   is due for a mammogram. Her Pap smear in June of this past year that was negative Past Medical History  Diagnosis Date  . Ovarian mass     Family Hx: Her mother was diagnosed with breast cancer at the age of 43 and died at age of 57. She did not have any genetic testing. Her father died of heart disease at the age of 84. Has a sister who died of juvenile diabetes at the age of 35. Family History  Problem Relation Age of Onset  . Breast cancer Mother   . Heart disease Father   . Diabetes Sister     Vitals:  Blood pressure 100/62, pulse 72, temperature 98.2 F (36.8 C), resp. rate 16, height 5' 6" (1.676 m), weight 156 lb 14.4 oz (71.169 kg).  Physical Exam: Well-nourished well-developed female in no acute distress.  Neck: Supple no lymphadenopathy no thyromegaly.  Lungs: Clear to auscultation  bilaterally.  Cardiovascular: Regular rate and rhythm.  Abdomen: Soft, there is a reducible umbilical hernia. There is a well-healed supraumbilical incision. She has a well healed low transverse incision. There is a 16 cm mass palpable in the lower abdomen. Is nontender. There is no fluid wave. The mass is mobile.  Groins: No lymphadenopathy.  Extremities: No edema, 2+ distal pulses.  Pelvic: Normal external female genitalia. The cervix is palpably normal. There was a large partially 18 cm masses freely mobile encompassing the adnexa. Difficult to ascertain in the left or right adnexa. The uterus is not markedly enlarged. There is no nodularity.  Assessment/Plan: 51-year-old pleasant female with a complex 16 cm adnexal mass arising in the setting of no evidence of extraovarian disease, a normal CA 125, and a significant family history for mother with breast cancer at the age of 43. The plan is to proceed with a total abdominal hysterectomy and bilateral salpingo-oophorectomy. The 16 cm adnexal mass will be  sent for frozen section.  If malignancy is identified the plan is to proceed with surgical staging and debulking. The questions of the patient and her husband were answered to their satisfaction. Then decide risks of the procedure include but are not limited to infection bleeding damage to surrounding structures on auscultation reoperation. The planned surgical date for the procedure is 09/20/2011.  Abubakar Crispo, MD 09/06/2011, 2:25 PM   

## 2011-09-20 NOTE — Anesthesia Postprocedure Evaluation (Signed)
  Anesthesia Post-op Note  Patient: Jordan Golden  Procedure(s) Performed: Procedure(s) (LRB): EXPLORATORY LAPAROTOMY (N/A) HYSTERECTOMY ABDOMINAL (N/A) LAPAROTOMY WITH STAGING (N/A) SALPINGO OOPHERECTOMY (Bilateral) OMENTECTOMY () LYMPHADENECTOMY ()  Patient Location: PACU  Anesthesia Type: General  Level of Consciousness: oriented and sedated  Airway and Oxygen Therapy: Patient Spontanous Breathing and Patient connected to nasal cannula oxygen  Post-op Pain: mild  Post-op Assessment: Post-op Vital signs reviewed, Patient's Cardiovascular Status Stable, Respiratory Function Stable and Patent Airway  Post-op Vital Signs: stable  Complications: No apparent anesthesia complications

## 2011-09-20 NOTE — Transfer of Care (Signed)
Immediate Anesthesia Transfer of Care Note  Patient: Jordan Golden  Procedure(s) Performed: Procedure(s) (LRB): EXPLORATORY LAPAROTOMY (N/A) HYSTERECTOMY ABDOMINAL (N/A) LAPAROTOMY WITH STAGING (N/A) SALPINGO OOPHERECTOMY (Bilateral) OMENTECTOMY () LYMPHADENECTOMY ()  Patient Location: PACU  Anesthesia Type: General  Level of Consciousness: awake, alert  and oriented  Airway & Oxygen Therapy: Patient Spontanous Breathing and Patient connected to face mask oxygen  Post-op Assessment: Report given to PACU RN  Post vital signs: Reviewed and stable  Complications: No apparent anesthesia complications

## 2011-09-20 NOTE — Anesthesia Procedure Notes (Signed)
Procedure Name: Intubation Date/Time: 09/20/2011 7:30 AM Performed by: Hulan Fess Pre-anesthesia Checklist: Patient identified, Emergency Drugs available, Suction available, Patient being monitored and Timeout performed Patient Re-evaluated:Patient Re-evaluated prior to inductionOxygen Delivery Method: Circle system utilized Preoxygenation: Pre-oxygenation with 100% oxygen Intubation Type: IV induction Ventilation: Mask ventilation without difficulty Laryngoscope Size: Mac and 3 Grade View: Grade I Tube type: Oral Tube size: 8.0 mm Number of attempts: 1 Placement Confirmation: ETT inserted through vocal cords under direct vision and breath sounds checked- equal and bilateral Secured at: 2.5 cm Tube secured with: Tape Dental Injury: Teeth and Oropharynx as per pre-operative assessment

## 2011-09-20 NOTE — Transfer of Care (Signed)
Immediate Anesthesia Transfer of Care Note  Patient: Jordan Golden  Procedure(s) Performed: Procedure(s) (LRB): EXPLORATORY LAPAROTOMY (N/A) HYSTERECTOMY ABDOMINAL (N/A) LAPAROTOMY WITH STAGING (N/A) SALPINGO OOPHERECTOMY (Bilateral) OMENTECTOMY () LYMPHADENECTOMY ()  Patient Location: PACU  Anesthesia Type: General  Level of Consciousness: awake, alert  and oriented  Airway & Oxygen Therapy: Patient Spontanous Breathing and Patient connected to face mask oxygen  Post-op Assessment: Report given to PACU RN  Post vital signs: Reviewed and stable  Complications: No apparent anesthesia complications 

## 2011-09-20 NOTE — Anesthesia Postprocedure Evaluation (Signed)
  Anesthesia Post-op Note  Patient: Jordan Golden  Procedure(s) Performed: Procedure(s) (LRB): EXPLORATORY LAPAROTOMY (N/A) HYSTERECTOMY ABDOMINAL (N/A) LAPAROTOMY WITH STAGING (N/A) SALPINGO OOPHERECTOMY (Bilateral) OMENTECTOMY () LYMPHADENECTOMY ()  Patient Location: PACU  Anesthesia Type: General  Level of Consciousness: oriented and sedated  Airway and Oxygen Therapy: Patient Spontanous Breathing and Patient connected to nasal cannula oxygen  Post-op Pain: mild  Post-op Assessment: Post-op Vital signs reviewed, Patient's Cardiovascular Status Stable, Respiratory Function Stable and Patent Airway  Post-op Vital Signs: stable  Complications: No apparent anesthesia complications 

## 2011-09-20 NOTE — Anesthesia Preprocedure Evaluation (Signed)
Anesthesia Evaluation  Patient identified by MRN, date of birth, ID band Patient awake    Reviewed: Allergy & Precautions, H&P , NPO status , Patient's Chart, lab work & pertinent test results, reviewed documented beta blocker date and time   Airway Mallampati: I TM Distance: >3 FB Neck ROM: Full    Dental  (+) Teeth Intact and Dental Advisory Given   Pulmonary neg pulmonary ROS,  breath sounds clear to auscultation        Cardiovascular negative cardio ROS  Rhythm:Regular Rate:Normal  Denies cardiac symptoms   Neuro/Psych Closed head injury 1996 Denies neurologic deficits negative psych ROS   GI/Hepatic negative GI ROS, Neg liver ROS,   Endo/Other  negative endocrine ROS  Renal/GU negative Renal ROS   Pelvic mass    Musculoskeletal negative musculoskeletal ROS (+)   Abdominal   Peds negative pediatric ROS (+)  Hematology negative hematology ROS (+)   Anesthesia Other Findings   Reproductive/Obstetrics negative OB ROS                           Anesthesia Physical Anesthesia Plan  ASA: II  Anesthesia Plan: General   Post-op Pain Management:    Induction: Intravenous  Airway Management Planned: Oral ETT  Additional Equipment:   Intra-op Plan:   Post-operative Plan: Extubation in OR  Informed Consent: I have reviewed the patients History and Physical, chart, labs and discussed the procedure including the risks, benefits and alternatives for the proposed anesthesia with the patient or authorized representative who has indicated his/her understanding and acceptance.   Dental advisory given  Plan Discussed with: CRNA and Surgeon  Anesthesia Plan Comments:         Anesthesia Quick Evaluation

## 2011-09-21 LAB — CBC
HCT: 30.6 % — ABNORMAL LOW (ref 36.0–46.0)
Hemoglobin: 10.4 g/dL — ABNORMAL LOW (ref 12.0–15.0)
RBC: 3.4 MIL/uL — ABNORMAL LOW (ref 3.87–5.11)

## 2011-09-21 LAB — BASIC METABOLIC PANEL
BUN: 6 mg/dL (ref 6–23)
CO2: 25 mEq/L (ref 19–32)
Chloride: 108 mEq/L (ref 96–112)
GFR calc non Af Amer: >90 mL/min (ref >90)
Glucose, Bld: 125 mg/dL — ABNORMAL HIGH (ref 70–99)
Potassium: 4 mEq/L (ref 3.5–5.1)
Sodium: 137 mEq/L (ref 135–145)

## 2011-09-21 MED ORDER — ENOXAPARIN (LOVENOX) PATIENT EDUCATION KIT
PACK | Freq: Once | Status: AC
  Administered 2011-09-21: 11:00:00
  Filled 2011-09-21: qty 1

## 2011-09-21 MED ORDER — ENOXAPARIN SODIUM 40 MG/0.4ML ~~LOC~~ SOLN
40.0000 mg | SUBCUTANEOUS | Status: DC
  Administered 2011-09-21 – 2011-09-22 (×2): 40 mg via SUBCUTANEOUS
  Filled 2011-09-21 (×2): qty 0.4

## 2011-09-21 NOTE — Progress Notes (Signed)
Patient ID: Jordan Golden, female   DOB: 1961-05-10, 51 y.o.   MRN: [id number] 1 Day Post-Op Procedure(s) (LRB): EXPLORATORY LAPAROTOMY (N/A) HYSTERECTOMY ABDOMINAL (N/A) LAPAROTOMY WITH STAGING (N/A) SALPINGO OOPHERECTOMY (Bilateral) OMENTECTOMY () LYMPHADENECTOMY ()  Subjective: Patient reports no complaints.    Objective: Vital signs in last 24 hours: Temp:  [97.3 F (36.3 C)-99 F (37.2 C)] 98.6 F (37 C) (03/20 0617) Pulse Rate:  [61-86] 77  (03/20 0617) Resp:  [11-20] 18  (03/20 0738) BP: (89-134)/(51-83) 99/62 mmHg (03/20 0617) SpO2:  [99 %-100 %] 100 % (03/20 0617) Weight:  [71.215 kg (157 lb)] 71.215 kg (157 lb) (03/19 1225) Last BM Date: 09/19/11  Intake/Output from previous day: 03/19 0701 - 03/20 0700 In: 2495 [P.O.:120; I.V.:2375] Out: 5025 [Urine:4925; Blood:100]  Physical Examination: General: alert GI: soft, non-tender; bowel sounds normal; no masses,  no organomegaly and incision: clean, dry and intact Extremities: extremities normal, atraumatic, no cyanosis or edema Vaginal Bleeding: none  Labs: WBC/Hgb/Hct/Plts:  6.6/10.4/30.6/242 (03/20 0439) BUN/Cr/glu/ALT/AST/amyl/lip:  6/0.72/--/--/--/--/-- (03/20 0439)   Assessment:  51 y.o. s/p Procedure(s): EXPLORATORY LAPAROTOMY HYSTERECTOMY ABDOMINAL LAPAROTOMY WITH STAGING SALPINGO OOPHERECTOMY OMENTECTOMY LYMPHADENECTOMY: progressing well Pain:  Pain is well-controlled on PCA.  Heme:Anemia: Mild/stable  GI:  Tolerating po: Yes     Prophylaxis: pharmacologic prophylaxis (with any of the following: enoxaparin (Lovenox) 40mg  SQ 2 hours prior to surgery then every day) and intermittent pneumatic compression boots.  Plan: Advance diet Encourage ambulation Discontinue IV fluids   LOS: 1 day    JACKSON-MOORE,Damier Disano A 09/21/2011, 9:33 AM

## 2011-09-22 MED ORDER — OXYCODONE-ACETAMINOPHEN 5-325 MG PO TABS: 1.0000 | ORAL_TABLET | Freq: Four times a day (QID) | ORAL | Status: AC | PRN

## 2011-09-22 MED ORDER — ENOXAPARIN SODIUM 40 MG/0.4ML ~~LOC~~ SOLN: 40.0000 mg | Freq: Every day | SUBCUTANEOUS | Status: DC

## 2011-09-22 MED ORDER — BISACODYL 10 MG RE SUPP
10.0000 mg | Freq: Every morning | RECTAL | Status: DC
  Administered 2011-09-22: 10 mg via RECTAL
  Filled 2011-09-22: qty 1

## 2011-09-22 NOTE — Progress Notes (Signed)
Patient is alert and oriented, vital signs are stable, discharge instructions reviewed with patient and patient's spouse, questions and concerns answered, prescriptions given, patient education on lovenox injections, pt tolerating regular diet and oral pain medication.  Means, Castor Gittleman N 09-22-11 15:02pm

## 2011-09-22 NOTE — Discharge Summary (Signed)
  Physician Discharge Summary  Patient ID: Jordan Golden MRN: [id number] DOB/AGE: 07-31-1960 51 y.o.  Admit date: 09/20/2011 Discharge date: 09/22/2011  Admission Diagnoses: Ovarian mass Discharge Diagnoses: Ovarian Cancer  Discharged Condition: good  Hospital Course: The patient underwent a staging procedure for ovarian cancer.  The postoperative course was uneventful.  She was discharged to home tolerating a regular diet.  Consults: None  Significant Diagnostic Studies: None  Treatments: surgery: See above  Discharge Exam: Blood pressure 115/72, pulse 88, temperature 99 F (37.2 C), temperature source Oral, resp. rate 18, height 5\' 6"  (1.676 m), weight 71.215 kg (157 lb), SpO2 99.00%. General appearance: alert and cooperative GI: soft, non-tender; bowel sounds normal; no masses,  no organomegaly Extremities: extremities normal, atraumatic, no cyanosis or edema Incision/Wound: C/D/I  Disposition: 01-Home or Self Care  Discharge Orders    Future Orders Please Complete By Expires   Diet - low sodium heart healthy      Increase activity slowly      May walk up steps      May shower / Bathe      Comments:   No tub baths for 6 weeks   Driving Restrictions      Comments:   No driving for 1 weeks   Lifting restrictions      Comments:   No lifting > 30 lbs for 6 weeks   Sexual Activity Restrictions      Comments:   No intercourse for 6 weeks   Discharge wound care:      Comments:   Keep clean and dry   Call MD for:  temperature >100.4      Call MD for:  persistant nausea and vomiting      Call MD for:  severe uncontrolled pain      Call MD for:  redness, tenderness, or signs of infection (pain, swelling, redness, odor or green/yellow discharge around incision site)      Call MD for:  persistant dizziness or light-headedness      Call MD for:  extreme fatigue        Medication List  As of 09/22/2011 10:03 AM   TAKE these medications         enoxaparin 40 MG/0.4ML  injection   Commonly known as: LOVENOX   Inject 0.4 mLs (40 mg total) into the skin daily.      multivitamin with minerals tablet   Take 1 tablet by mouth every morning.      omega-3 acid ethyl esters 1 G capsule   Commonly known as: LOVAZA   Take 2 g by mouth every morning.      oxyCODONE-acetaminophen 5-325 MG per tablet   Commonly known as: PERCOCET   Take 1-2 tablets by mouth every 6 (six) hours as needed (moderate to severe pain (when tolerating fluids)).      Vitamin D (Ergocalciferol) 50000 UNITS Caps   Commonly known as: DRISDOL   Take 50,000 Units by mouth every 7 (seven) days. On fridays           Follow-up Information    Call to follow up. (in two days to Telford Nab [phone])          Signed: Antionette Char A 09/22/2011, 10:03 AM

## 2011-09-22 NOTE — Progress Notes (Signed)
Patient ID: Jordan Golden, female   DOB: 08-06-1960, 51 y.o.   MRN: [id number] 2 Days Post-Op Procedure(s) (LRB): EXPLORATORY LAPAROTOMY (N/A) HYSTERECTOMY ABDOMINAL (N/A) LAPAROTOMY WITH STAGING (N/A) SALPINGO OOPHERECTOMY (Bilateral) OMENTECTOMY () LYMPHADENECTOMY ()  Subjective: Patient reports no flatus.  C/O incisional pain.  Objective: Vital signs in last 24 hours: Temp:  [98.4 F (36.9 C)-99 F (37.2 C)] 99 F (37.2 C) (03/21 0442) Pulse Rate:  [77-93] 88  (03/21 0442) Resp:  [18] 18  (03/21 0442) BP: (94-115)/(60-74) 115/72 mmHg (03/21 0442) SpO2:  [96 %-100 %] 99 % (03/21 0442) Last BM Date: 09/19/11  Intake/Output from previous day: 03/20 0701 - 03/21 0700 In: 480 [P.O.:480] Out: 1650 [Urine:1650]  Physical Examination: General: alert GI: soft, non-tender; bowel sounds normal; no masses,  no organomegaly and incision: clean, dry and intact Extremities: extremities normal, atraumatic, no cyanosis or edema Vaginal Bleeding: none  Labs:       Assessment:  51 y.o. s/p Procedure(s): EXPLORATORY LAPAROTOMY HYSTERECTOMY ABDOMINAL LAPAROTOMY WITH STAGING SALPINGO OOPHERECTOMY OMENTECTOMY LYMPHADENECTOMY: progressing well Pain:  Pain is well-controlled on oral meds.  GI:  Tolerating po: Yes     Prophylaxis: pharmacologic prophylaxis (with any of the following: enoxaparin (Lovenox) 40mg  SQ 2 hours prior to surgery then every day) and intermittent pneumatic compression boots.  Plan: Encourage ambulation Dulcolax supp   LOS: 2 days    JACKSON-MOORE,Vanshika Jastrzebski A 09/22/2011, 9:28 AM

## 2011-09-22 NOTE — Discharge Instructions (Signed)
09/22/2011  Return to work: 6 weeks  Activity: 1. Be up and out of the bed during the day.  Take a nap if needed.  You may walk up steps but be careful and use the hand rail.  Stair climbing will tire you more than you think, you may need to stop part way and rest.   2. No lifting or straining for 6 weeks.  3. No driving for 2 weeks.  Do Not drive if you are taking narcotic pain medicine.  4. Shower daily.  Use soap and water on your incision and pat dry; don't rub.   5. No sexual activity and nothing in the vagina for 6 weeks.  Diet: 1. Low sodium Heart Healthy Diet is recommended.  2. It is safe to use a laxative if you have difficulty moving your bowels.   Wound Care: 1. Keep clean and dry.  Shower daily.  Reasons to call the Doctor:   Fever - Oral temperature greater than 100.4 degrees Fahrenheit  Foul-smelling vaginal discharge  Difficulty urinating  Nausea and vomiting  Increased pain at the site of the incision that is unrelieved with pain medicine.  Difficulty breathing with or without chest pain  New calf pain especially if only on one side  Sudden, continuing increased vaginal bleeding with or without clots.   Contacts: For questions or concerns you should contact:  Dr. Antionette Char at [phone]  Dr. Laurette Schimke at [facility] [phone]

## 2011-09-27 ENCOUNTER — Ambulatory Visit: Payer: 59 | Attending: Gynecologic Oncology | Admitting: Gynecologic Oncology

## 2011-09-27 ENCOUNTER — Encounter: Payer: Self-pay | Admitting: Gynecologic Oncology

## 2011-09-27 VITALS — BP 102/70 | HR 62 | Temp 98.3°F | Resp 16 | Ht 66.0 in | Wt 152.9 lb

## 2011-09-27 DIAGNOSIS — Z833 Family history of diabetes mellitus: Secondary | ICD-10-CM | POA: Insufficient documentation

## 2011-09-27 DIAGNOSIS — Z7902 Long term (current) use of antithrombotics/antiplatelets: Secondary | ICD-10-CM | POA: Insufficient documentation

## 2011-09-27 DIAGNOSIS — Z8249 Family history of ischemic heart disease and other diseases of the circulatory system: Secondary | ICD-10-CM | POA: Insufficient documentation

## 2011-09-27 DIAGNOSIS — Z803 Family history of malignant neoplasm of breast: Secondary | ICD-10-CM | POA: Insufficient documentation

## 2011-09-27 DIAGNOSIS — C569 Malignant neoplasm of unspecified ovary: Secondary | ICD-10-CM

## 2011-09-27 DIAGNOSIS — Z90710 Acquired absence of both cervix and uterus: Secondary | ICD-10-CM | POA: Insufficient documentation

## 2011-09-27 DIAGNOSIS — M810 Age-related osteoporosis without current pathological fracture: Secondary | ICD-10-CM | POA: Insufficient documentation

## 2011-09-27 DIAGNOSIS — C562 Malignant neoplasm of left ovary: Secondary | ICD-10-CM | POA: Insufficient documentation
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
## 2011-09-27 NOTE — Patient Instructions (Signed)
Postop check with Dr. Duard Brady in 5 weeks Recommend genetic counseling Followup with Dr. Darrold Span for 3 cycles Taxol carboplatin therapy

## 2011-09-28 ENCOUNTER — Encounter (HOSPITAL_COMMUNITY): Payer: Self-pay | Admitting: Gynecologic Oncology

## 2011-09-28 ENCOUNTER — Encounter: Payer: Self-pay | Admitting: *Deleted

## 2011-09-28 NOTE — Progress Notes (Signed)
As per patient, records faxed to Dr Clifton James @ fax # [phone] for second opinion.  Demographics given to Dr Darrold Span for appt for chemo.

## 2011-10-03 ENCOUNTER — Telehealth: Payer: Self-pay | Admitting: Oncology

## 2011-10-03 NOTE — Telephone Encounter (Signed)
S/w the pt and she is aware of her new pt appt with dr Darrold Span on 10/11/2011@9 :30am

## 2011-10-04 ENCOUNTER — Telehealth: Payer: Self-pay | Admitting: Oncology

## 2011-10-04 NOTE — Telephone Encounter (Signed)
Referred by Dr. Laurette Schimke Dx- Ovarian CA

## 2011-10-05 ENCOUNTER — Telehealth: Payer: Self-pay | Admitting: Oncology

## 2011-10-05 NOTE — Telephone Encounter (Signed)
lmonvm adviisng the pt of her chemo educ class appt on 10/06/2011

## 2011-10-05 NOTE — Progress Notes (Signed)
SPOKE WITH MELISSA LEAD SCHEDULER AND TOLD HER THAT CHEMOTHERAPY ED CLASS WAS NOT SET UP AS PER POF 09-30-11.  MELISSA SAID TO GIVE POF BACK TO [facility] TO SCHEDULE CLASS PRIOR TO 10-11-11 APPT. REVIEWED POF WITH CRYSTAL AND SHE WILL CALL PT. WITH AN APPT. DR. Darrold Span WAS REVIEWING THE CHART TODAY AND COULD NOT FIND CLAAS SCHEDULED OR COMPLETED.

## 2011-10-06 ENCOUNTER — Other Ambulatory Visit: Payer: 59

## 2011-10-06 ENCOUNTER — Encounter: Payer: Self-pay | Admitting: *Deleted

## 2011-10-10 ENCOUNTER — Encounter (HOSPITAL_COMMUNITY): Payer: Self-pay | Admitting: Emergency Medicine

## 2011-10-10 ENCOUNTER — Emergency Department (HOSPITAL_COMMUNITY): Payer: No Typology Code available for payment source

## 2011-10-10 ENCOUNTER — Emergency Department (HOSPITAL_COMMUNITY)
Admission: EM | Admit: 2011-10-10 | Discharge: 2011-10-10 | Disposition: A | Discharge status: Home / Self Care | Payer: No Typology Code available for payment source | Attending: Emergency Medicine | Admitting: Emergency Medicine

## 2011-10-10 DIAGNOSIS — Z79899 Other long term (current) drug therapy: Secondary | ICD-10-CM | POA: Insufficient documentation

## 2011-10-10 DIAGNOSIS — M545 Low back pain, unspecified: Secondary | ICD-10-CM | POA: Insufficient documentation

## 2011-10-10 DIAGNOSIS — M549 Dorsalgia, unspecified: Secondary | ICD-10-CM

## 2011-10-10 DIAGNOSIS — M810 Age-related osteoporosis without current pathological fracture: Secondary | ICD-10-CM | POA: Insufficient documentation

## 2011-10-10 MED ORDER — ONDANSETRON HCL 8 MG PO TABS
4.0000 mg | ORAL_TABLET | Freq: Once | ORAL | Status: AC
  Administered 2011-10-10: 4 mg via ORAL
  Filled 2011-10-10: qty 1

## 2011-10-10 MED ORDER — OXYCODONE-ACETAMINOPHEN 5-325 MG PO TABS
1.0000 | ORAL_TABLET | Freq: Once | ORAL | Status: AC
  Administered 2011-10-10: 1 via ORAL
  Filled 2011-10-10: qty 1

## 2011-10-10 MED ORDER — OXYCODONE-ACETAMINOPHEN 5-325 MG PO TABS: 2.0000 | ORAL_TABLET | ORAL | Status: AC | PRN

## 2011-10-10 MED ORDER — MORPHINE SULFATE 4 MG/ML IJ SOLN: 4.0000 mg | Freq: Once | INTRAMUSCULAR | Status: DC

## 2011-10-10 MED ORDER — MORPHINE SULFATE 4 MG/ML IJ SOLN
4.0000 mg | Freq: Once | INTRAMUSCULAR | Status: AC
  Administered 2011-10-10: 4 mg via INTRAMUSCULAR
  Filled 2011-10-10: qty 1

## 2011-10-10 MED ORDER — IBUPROFEN 600 MG PO TABS: 600.0000 mg | ORAL_TABLET | Freq: Four times a day (QID) | ORAL | Status: AC | PRN
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
## 2011-10-10 NOTE — ED Notes (Signed)
Patient transported to CT 
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
## 2011-10-10 NOTE — ED Notes (Signed)
Pt c/o lower back pain. Pt 3 weeks post op from abd hysterectomy with bilateral salpingoophorectomy. Pt states she has seen a chiropractor in the past for lower back pain.
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
## 2011-10-11 ENCOUNTER — Ambulatory Visit: Payer: 59

## 2011-10-11 ENCOUNTER — Ambulatory Visit (HOSPITAL_BASED_OUTPATIENT_CLINIC_OR_DEPARTMENT_OTHER): Payer: 59 | Admitting: Oncology

## 2011-10-11 ENCOUNTER — Encounter: Payer: Self-pay | Admitting: Oncology

## 2011-10-11 ENCOUNTER — Other Ambulatory Visit: Payer: Self-pay | Admitting: *Deleted

## 2011-10-11 ENCOUNTER — Telehealth: Payer: Self-pay | Admitting: Oncology

## 2011-10-11 ENCOUNTER — Other Ambulatory Visit (HOSPITAL_BASED_OUTPATIENT_CLINIC_OR_DEPARTMENT_OTHER): Payer: 59 | Admitting: Lab

## 2011-10-11 VITALS — BP 126/76 | HR 72 | Temp 97.6°F | Ht 67.0 in | Wt 149.4 lb

## 2011-10-11 DIAGNOSIS — Z803 Family history of malignant neoplasm of breast: Secondary | ICD-10-CM

## 2011-10-11 DIAGNOSIS — Z98891 History of uterine scar from previous surgery: Secondary | ICD-10-CM | POA: Insufficient documentation

## 2011-10-11 DIAGNOSIS — C569 Malignant neoplasm of unspecified ovary: Secondary | ICD-10-CM

## 2011-10-11 DIAGNOSIS — Z9049 Acquired absence of other specified parts of digestive tract: Secondary | ICD-10-CM | POA: Insufficient documentation

## 2011-10-11 DIAGNOSIS — S0291XA Unspecified fracture of skull, initial encounter for closed fracture: Secondary | ICD-10-CM

## 2011-10-11 DIAGNOSIS — S22080A Wedge compression fracture of T11-T12 vertebra, initial encounter for closed fracture: Secondary | ICD-10-CM | POA: Insufficient documentation

## 2011-10-11 LAB — COMPREHENSIVE METABOLIC PANEL
BUN: 15 mg/dL (ref 6–23)
CO2: 28 mEq/L (ref 19–32)
Calcium: 9.3 mg/dL (ref 8.4–10.5)
Creatinine, Ser: 0.82 mg/dL (ref 0.50–1.10)
Glucose, Bld: 89 mg/dL (ref 70–99)
Total Bilirubin: 0.5 mg/dL (ref 0.3–1.2)

## 2011-10-11 LAB — CBC WITH DIFFERENTIAL/PLATELET
Basophils Absolute: 0.1 10*3/uL (ref 0.0–0.1)
Eosinophils Absolute: 0.1 10*3/uL (ref 0.0–0.5)
HCT: 36.8 % (ref 34.8–46.6)
LYMPH%: 22.8 % (ref 14.0–49.7)
MCV: 93.5 fL (ref 79.5–101.0)
MONO%: 8.6 % (ref 0.0–14.0)
NEUT#: 5.2 10*3/uL (ref 1.5–6.5)
NEUT%: 65.8 % (ref 38.4–76.8)
Platelets: 346 10*3/uL (ref 145–400)
RBC: 3.94 10*6/uL (ref 3.70–5.45)
nRBC: 0 % (ref 0–0)

## 2011-10-11 LAB — CA 125: CA 125: 12.4 U/mL (ref 0.0–30.2)

## 2011-10-11 MED ORDER — ONDANSETRON HCL 8 MG PO TABS: 8.0000 mg | ORAL_TABLET | Freq: Two times a day (BID) | ORAL | Status: DC | PRN

## 2011-10-11 MED ORDER — DEXAMETHASONE 4 MG PO TABS: 4.0000 mg | ORAL_TABLET | Freq: Two times a day (BID) | ORAL | Status: AC

## 2011-10-11 MED ORDER — LORAZEPAM 0.5 MG PO TABS: 0.5000 mg | ORAL_TABLET | Freq: Four times a day (QID) | ORAL | Status: AC | PRN
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
## 2011-10-11 NOTE — Progress Notes (Signed)
Patient came in today as a new patient,she has [place] healthcare insurance,I explain to her about our financial assistance program and the co-pay assistance program.I did give her an epp application to take home and fill out and return it to me.

## 2011-10-11 NOTE — Patient Instructions (Signed)
Chemotherpy will be Wed 4-17 at 10:00.  The night before chemo ( Tues 4-16) you need to take 5 of the 4 mg decadron (dexamethasone, steroid) tablets (=20 mg total) at 10 pm with food and another 5 tablets with food at 4 am on 4-17.  These doses will be 12 hrs and 6 hrs prior to taxol chemotherapy.  Night of chemo, whether or not you are nauseated, take ativan (lorazepam) at bedtime. This will also make you drowsy.  AM after chemo, whether or not you have nausea, take zofran  (ondansetron).  This will not make you drowsy.  You can call any time if questions or concerns: [phone]

## 2011-10-11 NOTE — Telephone Encounter (Signed)
gv pt appt schedule for April/may. °

## 2011-10-16 ENCOUNTER — Other Ambulatory Visit: Payer: Self-pay | Admitting: Oncology

## 2011-10-19 ENCOUNTER — Ambulatory Visit (HOSPITAL_BASED_OUTPATIENT_CLINIC_OR_DEPARTMENT_OTHER): Payer: 59

## 2011-10-19 VITALS — BP 116/72 | HR 69 | Temp 98.5°F

## 2011-10-19 DIAGNOSIS — Z5111 Encounter for antineoplastic chemotherapy: Secondary | ICD-10-CM

## 2011-10-19 DIAGNOSIS — C569 Malignant neoplasm of unspecified ovary: Secondary | ICD-10-CM

## 2011-10-19 MED ORDER — FAMOTIDINE IN NACL 20-0.9 MG/50ML-% IV SOLN
20.0000 mg | Freq: Once | INTRAVENOUS | Status: AC
  Administered 2011-10-19: 20 mg via INTRAVENOUS

## 2011-10-19 MED ORDER — PACLITAXEL CHEMO INJECTION 300 MG/50ML
175.0000 mg/m2 | Freq: Once | INTRAVENOUS | Status: AC
  Administered 2011-10-19: 312 mg via INTRAVENOUS
  Filled 2011-10-19: qty 52

## 2011-10-19 MED ORDER — DIPHENHYDRAMINE HCL 50 MG/ML IJ SOLN
50.0000 mg | Freq: Once | INTRAMUSCULAR | Status: AC
  Administered 2011-10-19: 50 mg via INTRAVENOUS

## 2011-10-19 MED ORDER — DEXAMETHASONE SODIUM PHOSPHATE 4 MG/ML IJ SOLN
20.0000 mg | Freq: Once | INTRAMUSCULAR | Status: AC
  Administered 2011-10-19: 20 mg via INTRAVENOUS

## 2011-10-19 MED ORDER — SODIUM CHLORIDE 0.9 % IV SOLN
Freq: Once | INTRAVENOUS | Status: AC
  Administered 2011-10-19: 10:00:00 via INTRAVENOUS

## 2011-10-19 MED ORDER — ONDANSETRON 16 MG/50ML IVPB (CHCC)
16.0000 mg | Freq: Once | INTRAVENOUS | Status: AC
  Administered 2011-10-19: 16 mg via INTRAVENOUS

## 2011-10-19 MED ORDER — SODIUM CHLORIDE 0.9 % IV SOLN
574.0000 mg | Freq: Once | INTRAVENOUS | Status: AC
  Administered 2011-10-19: 570 mg via INTRAVENOUS
  Filled 2011-10-19: qty 57

## 2011-10-20 ENCOUNTER — Telehealth: Payer: Self-pay | Admitting: Medical Oncology

## 2011-10-20 NOTE — Telephone Encounter (Signed)
I spoke with pt and she states that she had a good nigh. She has had no nausea or vomiting. She states she was a little drowsey from the pre-meds but other wise good. She does have her nausea medicines and she knows how to call  If any problems.

## 2011-10-26 ENCOUNTER — Other Ambulatory Visit: Payer: Self-pay | Admitting: *Deleted

## 2011-10-26 ENCOUNTER — Ambulatory Visit (HOSPITAL_BASED_OUTPATIENT_CLINIC_OR_DEPARTMENT_OTHER): Payer: No Typology Code available for payment source | Admitting: Oncology

## 2011-10-26 ENCOUNTER — Encounter: Payer: Self-pay | Admitting: Oncology

## 2011-10-26 ENCOUNTER — Other Ambulatory Visit: Payer: No Typology Code available for payment source | Admitting: Lab

## 2011-10-26 ENCOUNTER — Telehealth: Payer: Self-pay | Admitting: Oncology

## 2011-10-26 VITALS — BP 126/86 | HR 77 | Temp 97.6°F | Ht 67.0 in | Wt 144.1 lb

## 2011-10-26 DIAGNOSIS — C569 Malignant neoplasm of unspecified ovary: Secondary | ICD-10-CM

## 2011-10-26 DIAGNOSIS — Z803 Family history of malignant neoplasm of breast: Secondary | ICD-10-CM

## 2011-10-26 LAB — CBC WITH DIFFERENTIAL/PLATELET
BASO%: 1.9 % (ref 0.0–2.0)
Eosinophils Absolute: 0.2 10*3/uL (ref 0.0–0.5)
HCT: 38.7 % (ref 34.8–46.6)
LYMPH%: 43.2 % (ref 14.0–49.7)
MCHC: 33.5 g/dL (ref 31.5–36.0)
MONO#: 0.2 10*3/uL (ref 0.1–0.9)
NEUT#: 2 10*3/uL (ref 1.5–6.5)
NEUT%: 45 % (ref 38.4–76.8)
Platelets: 185 10*3/uL (ref 145–400)
WBC: 4.4 10*3/uL (ref 3.9–10.3)
lymph#: 1.9 10*3/uL (ref 0.9–3.3)
nRBC: 0 % (ref 0–0)

## 2011-10-26 MED ORDER — DEXAMETHASONE 4 MG PO TABS: ORAL_TABLET | ORAL | Status: DC

## 2011-10-26 NOTE — Telephone Encounter (Signed)
appts made and printed for pt aom °
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
## 2011-10-26 NOTE — Patient Instructions (Signed)
Call if very fatigued or other problems before next scheduled blood counts on 11-02-2011  Be sure someone lets you know CBC results when you come to gyn onc appointment on 11-02-2011

## 2011-11-02 ENCOUNTER — Other Ambulatory Visit: Payer: Self-pay | Admitting: Oncology

## 2011-11-02 ENCOUNTER — Ambulatory Visit (HOSPITAL_BASED_OUTPATIENT_CLINIC_OR_DEPARTMENT_OTHER): Payer: 59

## 2011-11-02 ENCOUNTER — Ambulatory Visit: Payer: 59 | Attending: Gynecologic Oncology | Admitting: Gynecologic Oncology

## 2011-11-02 ENCOUNTER — Encounter: Payer: Self-pay | Admitting: Gynecologic Oncology

## 2011-11-02 ENCOUNTER — Telehealth: Payer: Self-pay | Admitting: Oncology

## 2011-11-02 VITALS — BP 102/64 | HR 70 | Temp 98.1°F | Resp 14 | Ht 66.02 in | Wt 149.1 lb

## 2011-11-02 DIAGNOSIS — C569 Malignant neoplasm of unspecified ovary: Secondary | ICD-10-CM | POA: Insufficient documentation

## 2011-11-02 DIAGNOSIS — D702 Other drug-induced agranulocytosis: Secondary | ICD-10-CM

## 2011-11-02 DIAGNOSIS — Z79899 Other long term (current) drug therapy: Secondary | ICD-10-CM | POA: Insufficient documentation

## 2011-11-02 LAB — CBC WITH DIFFERENTIAL/PLATELET
Basophils Absolute: 0.1 10*3/uL (ref 0.0–0.1)
Eosinophils Absolute: 0.1 10*3/uL (ref 0.0–0.5)
HCT: 36 % (ref 34.8–46.6)
HGB: 11.9 g/dL (ref 11.6–15.9)
MCV: 93.7 fL (ref 79.5–101.0)
NEUT#: 0.9 10*3/uL — ABNORMAL LOW (ref 1.5–6.5)
NEUT%: 27.5 % — ABNORMAL LOW (ref 38.4–76.8)
RDW: 14.4 % (ref 11.2–14.5)
lymph#: 1.8 10*3/uL (ref 0.9–3.3)

## 2011-11-02 MED ORDER — FILGRASTIM 300 MCG/0.5ML IJ SOLN
300.0000 ug | Freq: Once | INTRAMUSCULAR | Status: AC
  Administered 2011-11-02: 300 ug via SUBCUTANEOUS
  Filled 2011-11-02: qty 0.5

## 2011-11-02 NOTE — Progress Notes (Signed)
SPOKE WITH MS. [place] AND TOLD HER THAT HER ANC IS 0.9 TODAY. PT. IS 2 WEEKS FROM  1ST TAXOL CARO TREATMENT 10-19-11.  DR. Darrold Span WANTS TO GIVE HER NEUPOGEN X2 DAYS WITH REPEAT CBC 11-04-11.  REVIEWED NEUTROPENIC PRECAUTIONS AND BONE PAIN WITH NEUPOGEN INJECTIONS.  SUGGESTED CLARITIN 10 MG DAILY TO HELP DECREASE PAIN FROM INJECTIONS.  PT. GIVEN A MASK TO WEAR. PT. AFEBRILE AT 98.1  APPTS. SCHEDULED FOR THE INJECTIONS. PT. VERBALIZED UNDERSTANDING.

## 2011-11-02 NOTE — Telephone Encounter (Signed)
Gv pt appt for 05/02-05/03
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
## 2011-11-02 NOTE — Patient Instructions (Signed)
F/u with Dr. Darrold Span as scheduled.

## 2011-11-03 ENCOUNTER — Ambulatory Visit (HOSPITAL_BASED_OUTPATIENT_CLINIC_OR_DEPARTMENT_OTHER): Payer: 59

## 2011-11-03 VITALS — BP 112/71 | HR 80 | Temp 97.4°F

## 2011-11-03 DIAGNOSIS — D702 Other drug-induced agranulocytosis: Secondary | ICD-10-CM

## 2011-11-03 DIAGNOSIS — C569 Malignant neoplasm of unspecified ovary: Secondary | ICD-10-CM

## 2011-11-03 MED ORDER — FILGRASTIM 300 MCG/0.5ML IJ SOLN
300.0000 ug | Freq: Once | INTRAMUSCULAR | Status: AC
  Administered 2011-11-03: 300 ug via SUBCUTANEOUS
  Filled 2011-11-03: qty 0.5

## 2011-11-04 ENCOUNTER — Ambulatory Visit: Payer: 59

## 2011-11-04 ENCOUNTER — Other Ambulatory Visit: Payer: 59 | Admitting: Lab

## 2011-11-04 ENCOUNTER — Other Ambulatory Visit (HOSPITAL_BASED_OUTPATIENT_CLINIC_OR_DEPARTMENT_OTHER): Payer: 59 | Admitting: Lab

## 2011-11-04 VITALS — BP 118/75 | HR 68 | Temp 98.2°F

## 2011-11-04 DIAGNOSIS — C569 Malignant neoplasm of unspecified ovary: Secondary | ICD-10-CM

## 2011-11-04 LAB — CBC WITH DIFFERENTIAL/PLATELET
Basophils Absolute: 0.2 10*3/uL — ABNORMAL HIGH (ref 0.0–0.1)
Eosinophils Absolute: 0.2 10*3/uL (ref 0.0–0.5)
HCT: 34.9 % (ref 34.8–46.6)
HGB: 11.3 g/dL — ABNORMAL LOW (ref 11.6–15.9)
LYMPH%: 14.4 % (ref 14.0–49.7)
MCV: 94.1 fL (ref 79.5–101.0)
MONO#: 1.9 10*3/uL — ABNORMAL HIGH (ref 0.1–0.9)
NEUT#: 11.4 10*3/uL — ABNORMAL HIGH (ref 1.5–6.5)
NEUT%: 71.6 % (ref 38.4–76.8)
Platelets: 233 10*3/uL (ref 145–400)
WBC: 15.9 10*3/uL — ABNORMAL HIGH (ref 3.9–10.3)
nRBC: 0 % (ref 0–0)

## 2011-11-04 LAB — COMPREHENSIVE METABOLIC PANEL
ALT: 15 U/L (ref 0–35)
AST: 11 U/L (ref 0–37)
CO2: 27 mEq/L (ref 19–32)
Chloride: 107 mEq/L (ref 96–112)
Sodium: 140 mEq/L (ref 135–145)
Total Bilirubin: 0.2 mg/dL — ABNORMAL LOW (ref 0.3–1.2)
Total Protein: 5.7 g/dL — ABNORMAL LOW (ref 6.0–8.3)

## 2011-11-04 LAB — CA 125: CA 125: 6.1 U/mL (ref 0.0–30.2)

## 2011-11-04 MED ORDER — FILGRASTIM 300 MCG/0.5ML IJ SOLN
300.0000 ug | Freq: Once | INTRAMUSCULAR | Status: DC
  Filled 2011-11-04: qty 0.5

## 2011-11-04 NOTE — Progress Notes (Signed)
Not given per parameters see notes.  (ordered from pharm prior to param clarification)

## 2011-11-08 ENCOUNTER — Other Ambulatory Visit: Payer: Self-pay

## 2011-11-08 ENCOUNTER — Telehealth: Payer: Self-pay | Admitting: *Deleted

## 2011-11-08 ENCOUNTER — Encounter: Payer: Self-pay | Admitting: Oncology

## 2011-11-08 ENCOUNTER — Other Ambulatory Visit: Payer: Self-pay | Admitting: Oncology

## 2011-11-08 ENCOUNTER — Other Ambulatory Visit (HOSPITAL_BASED_OUTPATIENT_CLINIC_OR_DEPARTMENT_OTHER): Payer: 59 | Admitting: Lab

## 2011-11-08 ENCOUNTER — Ambulatory Visit (HOSPITAL_BASED_OUTPATIENT_CLINIC_OR_DEPARTMENT_OTHER): Payer: 59 | Admitting: Oncology

## 2011-11-08 VITALS — BP 124/77 | HR 79 | Temp 98.8°F | Ht 66.02 in | Wt 151.6 lb

## 2011-11-08 DIAGNOSIS — N390 Urinary tract infection, site not specified: Secondary | ICD-10-CM

## 2011-11-08 DIAGNOSIS — Z803 Family history of malignant neoplasm of breast: Secondary | ICD-10-CM

## 2011-11-08 DIAGNOSIS — C569 Malignant neoplasm of unspecified ovary: Secondary | ICD-10-CM

## 2011-11-08 DIAGNOSIS — R350 Frequency of micturition: Secondary | ICD-10-CM

## 2011-11-08 LAB — CBC WITH DIFFERENTIAL/PLATELET
BASO%: 0.7 % (ref 0.0–2.0)
EOS%: 0.7 % (ref 0.0–7.0)
HCT: 36.3 % (ref 34.8–46.6)
LYMPH%: 20.1 % (ref 14.0–49.7)
MCH: 30.3 pg (ref 25.1–34.0)
MCHC: 33.6 g/dL (ref 31.5–36.0)
MCV: 90.1 fL (ref 79.5–101.0)
MONO%: 13.4 % (ref 0.0–14.0)
NEUT%: 65.1 % (ref 38.4–76.8)
lymph#: 2.3 10*3/uL (ref 0.9–3.3)

## 2011-11-08 LAB — URINALYSIS, MICROSCOPIC - CHCC
Bilirubin (Urine): NEGATIVE
Nitrite: POSITIVE
Protein: 300 mg/dL
pH: 7.5 (ref 4.6–8.0)

## 2011-11-08 MED ORDER — CIPROFLOXACIN HCL 250 MG PO TABS: 250.0000 mg | ORAL_TABLET | Freq: Two times a day (BID) | ORAL | Status: DC

## 2011-11-08 NOTE — Telephone Encounter (Signed)
gave patient appointment for 11-16-2011 and 11-17-2011 cancelled 11-30-2011 treatment sent email to [facility] setting up treatment on 12-07-2011

## 2011-11-08 NOTE — Patient Instructions (Signed)
Chemotherapy 11:15 on 11-09-11   12 hrs before chemo, ~ 11 pm tonight, take five decadron (dexamethasone, steroid) tablets (4mg  tablets, total 20 mg dose) with food. 6 hrs before chemo, ~ 5 AM 11-09-11, take five decadron tablets with food  We will give you 2 neupogen injections ~ 1 week after the chemo. Dr Darrold Span will see you the following week and recheck blood counts then. Call if you are extremely tired or if any symptoms of infection before Dr Precious Reel appointment.

## 2011-11-08 NOTE — Telephone Encounter (Signed)
gave patient appointment for 11-16-2011 lab and injection 11-17-2011 injection only cancelled 11-30-2011 sent michelle an email thru [facility] to give patient appointment for 12-07-2011 for long taxol / [place] treatment
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
## 2011-11-08 NOTE — Telephone Encounter (Signed)
Per staff message from [place], I have scheduled treatment appts for the patient. Appts in computer and Jacki Cones aware. JMW

## 2011-11-08 NOTE — Progress Notes (Signed)
SPOKE WITH MS. Weldon AND TOLD HER THAT THE URINE SPECIMEN LOOKS SUSPICIOUS FOR A UTI.  WILL ELECTRONICALLY SEND A PRESCRIPTION TO [facility] FOR CIPRO PER DR. Darrold Span. WILL CALLPT. WITH URINE CULTURE RESULTS WHEN AVAILABLE.

## 2011-11-09 ENCOUNTER — Ambulatory Visit (HOSPITAL_BASED_OUTPATIENT_CLINIC_OR_DEPARTMENT_OTHER): Payer: 59

## 2011-11-09 VITALS — BP 127/80 | HR 79 | Temp 97.7°F

## 2011-11-09 DIAGNOSIS — Z5111 Encounter for antineoplastic chemotherapy: Secondary | ICD-10-CM

## 2011-11-09 DIAGNOSIS — C569 Malignant neoplasm of unspecified ovary: Secondary | ICD-10-CM

## 2011-11-09 MED ORDER — DEXAMETHASONE SODIUM PHOSPHATE 4 MG/ML IJ SOLN
20.0000 mg | Freq: Once | INTRAMUSCULAR | Status: AC
  Administered 2011-11-09: 20 mg via INTRAVENOUS

## 2011-11-09 MED ORDER — FAMOTIDINE IN NACL 20-0.9 MG/50ML-% IV SOLN
20.0000 mg | Freq: Once | INTRAVENOUS | Status: AC
  Administered 2011-11-09: 20 mg via INTRAVENOUS

## 2011-11-09 MED ORDER — SODIUM CHLORIDE 0.9 % IV SOLN
569.0000 mg | Freq: Once | INTRAVENOUS | Status: AC
  Administered 2011-11-09: 570 mg via INTRAVENOUS
  Filled 2011-11-09: qty 57

## 2011-11-09 MED ORDER — ONDANSETRON 16 MG/50ML IVPB (CHCC)
16.0000 mg | Freq: Once | INTRAVENOUS | Status: AC
  Administered 2011-11-09: 16 mg via INTRAVENOUS

## 2011-11-09 MED ORDER — PACLITAXEL CHEMO INJECTION 300 MG/50ML
175.0000 mg/m2 | Freq: Once | INTRAVENOUS | Status: AC
  Administered 2011-11-09: 312 mg via INTRAVENOUS
  Filled 2011-11-09: qty 52

## 2011-11-09 MED ORDER — DIPHENHYDRAMINE HCL 50 MG/ML IJ SOLN
50.0000 mg | Freq: Once | INTRAMUSCULAR | Status: AC
  Administered 2011-11-09: 50 mg via INTRAVENOUS

## 2011-11-09 MED ORDER — SODIUM CHLORIDE 0.9 % IV SOLN
Freq: Once | INTRAVENOUS | Status: AC
  Administered 2011-11-09: 12:00:00 via INTRAVENOUS

## 2011-11-09 NOTE — Patient Instructions (Signed)
[facility] Discharge Instructions for Patients Receiving Chemotherapy  Today you received the following chemotherapy agents Paclitaxel and Carboplatin.  To help prevent nausea and vomiting after your treatment, we encourage you to take your nausea medication. Begin taking it as often as prescribed by your physician.    If you develop nausea and vomiting that is not controlled by your nausea medication, call the clinic at [phone]. If it is after clinic hours your family physician or the after hours number for the clinic or go to the Emergency Department.   BELOW ARE SYMPTOMS THAT SHOULD BE REPORTED IMMEDIATELY:  *FEVER GREATER THAN 100.5 F  *CHILLS WITH OR WITHOUT FEVER  NAUSEA AND VOMITING THAT IS NOT CONTROLLED WITH YOUR NAUSEA MEDICATION  *UNUSUAL SHORTNESS OF BREATH  *UNUSUAL BRUISING OR BLEEDING  TENDERNESS IN MOUTH AND THROAT WITH OR WITHOUT PRESENCE OF ULCERS  *URINARY PROBLEMS  *BOWEL PROBLEMS  UNUSUAL RASH Items with * indicate a potential emergency and should be followed up as soon as possible.  One of the nurses will contact you 24 hours after your treatment. Please let the nurse know about any problems that you may have experienced. Feel free to call the clinic you have any questions or concerns. The clinic phone number is [phone].   I have been informed and understand all the instructions given to me. I know to contact the clinic, my physician, or go to the Emergency Department if any problems should occur. I do not have any questions at this time, but understand that I may call the clinic during office hours   should I have any questions or need assistance in obtaining follow up care.    __________________________________________  _____________  __________ Signature of Patient or Authorized Representative            Date                   Time    __________________________________________ Nurse's Signature

## 2011-11-10 ENCOUNTER — Telehealth: Payer: Self-pay | Admitting: *Deleted

## 2011-11-10 NOTE — Telephone Encounter (Signed)
Notified patient that urine culture was positive for e. Coli. Continue cipro and push po fluids.

## 2011-11-14 ENCOUNTER — Telehealth: Payer: Self-pay

## 2011-11-14 NOTE — Telephone Encounter (Signed)
Jordan Golden CALLED TO SEE IF SHE COULD FORGO THE NEUPOGEN THIS CYCLE FOR 5-15;5-16 SINCE SHE WILL HAVE CHEMOTHERAPY 4 WEEKS APART SINCE SHE IS ATTENDING A CONFERENCE 11-30-11.   SHE SAID THAT HER COUNTS HAD RECOVERED WITH JUST 2 NEUPOGEN  INJECTIONS. COULD SHE SEE IF COUNTS COME UP ON THEIR OWN THIS CYCLE?

## 2011-11-16 ENCOUNTER — Telehealth: Payer: Self-pay

## 2011-11-16 ENCOUNTER — Other Ambulatory Visit: Payer: Self-pay

## 2011-11-16 ENCOUNTER — Ambulatory Visit (HOSPITAL_BASED_OUTPATIENT_CLINIC_OR_DEPARTMENT_OTHER): Payer: 59

## 2011-11-16 ENCOUNTER — Other Ambulatory Visit (HOSPITAL_BASED_OUTPATIENT_CLINIC_OR_DEPARTMENT_OTHER): Payer: 59 | Admitting: Lab

## 2011-11-16 ENCOUNTER — Ambulatory Visit: Payer: 59

## 2011-11-16 VITALS — BP 132/83 | HR 79 | Temp 97.1°F

## 2011-11-16 DIAGNOSIS — C569 Malignant neoplasm of unspecified ovary: Secondary | ICD-10-CM

## 2011-11-16 LAB — CBC WITH DIFFERENTIAL/PLATELET
BASO%: 3 % — ABNORMAL HIGH (ref 0.0–2.0)
LYMPH%: 50 % — ABNORMAL HIGH (ref 14.0–49.7)
MCHC: 34.5 g/dL (ref 31.5–36.0)
MONO#: 0.2 10*3/uL (ref 0.1–0.9)
Platelets: 142 10*3/uL — ABNORMAL LOW (ref 145–400)
RBC: 3.84 10*6/uL (ref 3.70–5.45)
WBC: 3 10*3/uL — ABNORMAL LOW (ref 3.9–10.3)
lymph#: 1.5 10*3/uL (ref 0.9–3.3)
nRBC: 0 % (ref 0–0)

## 2011-11-16 MED ORDER — FILGRASTIM 300 MCG/0.5ML IJ SOLN
300.0000 ug | Freq: Once | INTRAMUSCULAR | Status: AC
  Administered 2011-11-16: 300 ug via SUBCUTANEOUS
  Filled 2011-11-16: qty 0.5

## 2011-11-16 NOTE — Telephone Encounter (Signed)
Spoke with Ms. Jordan Golden and told her that her anc today was 1.2.  She is only 1 week post chemotherapy.  Dr. Darrold Span would like for her to have three days of neupogen this week rather than two. Jordan Golden was scheduled for an  injection on 11-18-11.  Jordan Golden.

## 2011-11-16 NOTE — Telephone Encounter (Signed)
Spoke with Ms. Jordan Golden and told her that Dr. Darrold Span would like for her to proceed with the neupogen injections today and tomorrow especially with her going out of town for a conference. Need to be sure her counts are not low.  Pt. Verbalized understanding and will keep appts. for injections.

## 2011-11-17 ENCOUNTER — Ambulatory Visit (HOSPITAL_BASED_OUTPATIENT_CLINIC_OR_DEPARTMENT_OTHER): Payer: 59

## 2011-11-17 VITALS — BP 114/75 | HR 86 | Temp 97.2°F

## 2011-11-17 DIAGNOSIS — C569 Malignant neoplasm of unspecified ovary: Secondary | ICD-10-CM

## 2011-11-17 MED ORDER — FILGRASTIM 300 MCG/0.5ML IJ SOLN
300.0000 ug | Freq: Once | INTRAMUSCULAR | Status: AC
  Administered 2011-11-17: 300 ug via SUBCUTANEOUS
  Filled 2011-11-17: qty 0.5

## 2011-11-18 ENCOUNTER — Ambulatory Visit (HOSPITAL_BASED_OUTPATIENT_CLINIC_OR_DEPARTMENT_OTHER): Payer: 59

## 2011-11-18 ENCOUNTER — Other Ambulatory Visit: Payer: Self-pay | Admitting: Oncology

## 2011-11-18 VITALS — BP 126/81 | HR 82 | Temp 97.8°F

## 2011-11-18 DIAGNOSIS — D702 Other drug-induced agranulocytosis: Secondary | ICD-10-CM

## 2011-11-18 DIAGNOSIS — C569 Malignant neoplasm of unspecified ovary: Secondary | ICD-10-CM

## 2011-11-18 MED ORDER — FILGRASTIM 300 MCG/0.5ML IJ SOLN
300.0000 ug | Freq: Once | INTRAMUSCULAR | Status: AC
  Administered 2011-11-18: 300 ug via SUBCUTANEOUS
  Filled 2011-11-18: qty 0.5

## 2011-11-23 ENCOUNTER — Ambulatory Visit (HOSPITAL_BASED_OUTPATIENT_CLINIC_OR_DEPARTMENT_OTHER): Payer: 59 | Admitting: Oncology

## 2011-11-23 ENCOUNTER — Other Ambulatory Visit (HOSPITAL_BASED_OUTPATIENT_CLINIC_OR_DEPARTMENT_OTHER): Payer: 59 | Admitting: Lab

## 2011-11-23 ENCOUNTER — Telehealth: Payer: Self-pay | Admitting: Oncology

## 2011-11-23 VITALS — BP 123/80 | HR 75 | Temp 97.6°F | Ht 66.0 in | Wt 152.4 lb

## 2011-11-23 DIAGNOSIS — C569 Malignant neoplasm of unspecified ovary: Secondary | ICD-10-CM

## 2011-11-23 DIAGNOSIS — R350 Frequency of micturition: Secondary | ICD-10-CM

## 2011-11-23 DIAGNOSIS — Z803 Family history of malignant neoplasm of breast: Secondary | ICD-10-CM

## 2011-11-23 LAB — COMPREHENSIVE METABOLIC PANEL
ALT: 20 U/L (ref 0–35)
CO2: 27 mEq/L (ref 19–32)
Creatinine, Ser: 0.72 mg/dL (ref 0.50–1.10)
Total Bilirubin: 0.2 mg/dL — ABNORMAL LOW (ref 0.3–1.2)

## 2011-11-23 LAB — CBC WITH DIFFERENTIAL/PLATELET
BASO%: 0.9 % (ref 0.0–2.0)
Basophils Absolute: 0 10*3/uL (ref 0.0–0.1)
EOS%: 2.1 % (ref 0.0–7.0)
HGB: 11.9 g/dL (ref 11.6–15.9)
MCH: 31.2 pg (ref 25.1–34.0)
RDW: 14.6 % — ABNORMAL HIGH (ref 11.2–14.5)
lymph#: 1.9 10*3/uL (ref 0.9–3.3)
nRBC: 0 % (ref 0–0)

## 2011-11-23 LAB — CA 125: CA 125: 5.4 U/mL (ref 0.0–30.2)

## 2011-11-23 NOTE — Telephone Encounter (Signed)
Gv pt appt for june2013 

## 2011-11-23 NOTE — Patient Instructions (Signed)
Appointments as scheduled. 

## 2011-11-26 ENCOUNTER — Encounter: Payer: Self-pay | Admitting: Oncology
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
## 2011-11-29 ENCOUNTER — Encounter: Payer: Self-pay | Admitting: Oncology

## 2011-11-29 NOTE — Progress Notes (Signed)
Message sent to gyn onc to confirm timing of next apt with Dr Duard Brady. Jordan Golden

## 2011-11-30 ENCOUNTER — Ambulatory Visit: Payer: No Typology Code available for payment source

## 2011-12-06 ENCOUNTER — Encounter: Payer: Self-pay | Admitting: Oncology

## 2011-12-06 ENCOUNTER — Other Ambulatory Visit (HOSPITAL_BASED_OUTPATIENT_CLINIC_OR_DEPARTMENT_OTHER): Payer: 59 | Admitting: Lab

## 2011-12-06 ENCOUNTER — Telehealth: Payer: Self-pay

## 2011-12-06 ENCOUNTER — Other Ambulatory Visit: Payer: Self-pay

## 2011-12-06 DIAGNOSIS — Z1231 Encounter for screening mammogram for malignant neoplasm of breast: Secondary | ICD-10-CM

## 2011-12-06 DIAGNOSIS — C569 Malignant neoplasm of unspecified ovary: Secondary | ICD-10-CM

## 2011-12-06 LAB — CBC WITH DIFFERENTIAL/PLATELET
Basophils Absolute: 0.1 10*3/uL (ref 0.0–0.1)
HCT: 35.8 % (ref 34.8–46.6)
HGB: 12.1 g/dL (ref 11.6–15.9)
MCH: 30.9 pg (ref 25.1–34.0)
MONO#: 0.4 10*3/uL (ref 0.1–0.9)
NEUT%: 36.3 % — ABNORMAL LOW (ref 38.4–76.8)
WBC: 3.6 10*3/uL — ABNORMAL LOW (ref 3.9–10.3)
lymph#: 1.7 10*3/uL (ref 0.9–3.3)

## 2011-12-06 NOTE — Progress Notes (Signed)
I spoke with Jordan Golden this afternoon and she said that she would be in here on Friday and she would bring in her [employer] information.

## 2011-12-06 NOTE — Telephone Encounter (Signed)
Spoke with Jordan Golden and told her that her ANC was 1.3 today.  This is too low for her to be treated tomorrow per Dr. Darrold Span.  ANC needs to be > or equal to 1.5.  Pt. Feeling fine except for some head congestion.  Jordan Golden is very disappointed that she cannot have her treatment tomorrow.  Her sister is coming in tonight to be with her this nest week. Her son is coming home 01-03-12 and her daughter is getting married next month.  Reviewed with Dr. Darrold Span  And Jordan Golden can come in Friday 12-09-11 and see if her Anc is with in the parameters for treatment and can receive  treatment Friday.  Appointment is set for 1000 for lab and 1030 for treatment. Also lm for patient after this conversation that Dr. Darrold Span wants her to have bilat. Screening mammogram prior to the 12-28-11 visit. Sent order  to scheduler to set up appointment.

## 2011-12-07 ENCOUNTER — Telehealth: Payer: Self-pay | Admitting: Oncology

## 2011-12-07 ENCOUNTER — Ambulatory Visit: Payer: 59

## 2011-12-07 NOTE — Telephone Encounter (Signed)
Talked to pt, she is aware of [facility]. Pt will bring her old mammogram result from [facility]

## 2011-12-09 ENCOUNTER — Ambulatory Visit (HOSPITAL_BASED_OUTPATIENT_CLINIC_OR_DEPARTMENT_OTHER): Payer: 59

## 2011-12-09 ENCOUNTER — Other Ambulatory Visit: Payer: Self-pay | Admitting: Oncology

## 2011-12-09 ENCOUNTER — Other Ambulatory Visit (HOSPITAL_BASED_OUTPATIENT_CLINIC_OR_DEPARTMENT_OTHER): Payer: 59 | Admitting: Lab

## 2011-12-09 VITALS — BP 132/73 | HR 84 | Temp 98.1°F

## 2011-12-09 DIAGNOSIS — C569 Malignant neoplasm of unspecified ovary: Secondary | ICD-10-CM

## 2011-12-09 DIAGNOSIS — Z5111 Encounter for antineoplastic chemotherapy: Secondary | ICD-10-CM

## 2011-12-09 LAB — CBC WITH DIFFERENTIAL/PLATELET
Basophils Absolute: 0 10*3/uL (ref 0.0–0.1)
Eosinophils Absolute: 0 10*3/uL (ref 0.0–0.5)
HCT: 36.5 % (ref 34.8–46.6)
HGB: 12.7 g/dL (ref 11.6–15.9)
LYMPH%: 19.9 % (ref 14.0–49.7)
MCV: 89.5 fL (ref 79.5–101.0)
MONO%: 0.4 % (ref 0.0–14.0)
NEUT#: 1.8 10*3/uL (ref 1.5–6.5)
Platelets: 207 10*3/uL (ref 145–400)

## 2011-12-09 MED ORDER — PACLITAXEL CHEMO INJECTION 300 MG/50ML
175.0000 mg/m2 | Freq: Once | INTRAVENOUS | Status: AC
  Administered 2011-12-09: 312 mg via INTRAVENOUS
  Filled 2011-12-09: qty 52

## 2011-12-09 MED ORDER — SODIUM CHLORIDE 0.9 % IV SOLN
Freq: Once | INTRAVENOUS | Status: AC
  Administered 2011-12-09: 11:00:00 via INTRAVENOUS

## 2011-12-09 MED ORDER — DEXAMETHASONE SODIUM PHOSPHATE 4 MG/ML IJ SOLN
20.0000 mg | Freq: Once | INTRAMUSCULAR | Status: AC
  Administered 2011-12-09: 20 mg via INTRAVENOUS

## 2011-12-09 MED ORDER — ONDANSETRON 16 MG/50ML IVPB (CHCC)
16.0000 mg | Freq: Once | INTRAVENOUS | Status: AC
  Administered 2011-12-09: 16 mg via INTRAVENOUS

## 2011-12-09 MED ORDER — SODIUM CHLORIDE 0.9 % IV SOLN
631.0000 mg | Freq: Once | INTRAVENOUS | Status: AC
  Administered 2011-12-09: 630 mg via INTRAVENOUS
  Filled 2011-12-09: qty 63

## 2011-12-09 MED ORDER — DIPHENHYDRAMINE HCL 50 MG/ML IJ SOLN
50.0000 mg | Freq: Once | INTRAMUSCULAR | Status: AC
  Administered 2011-12-09: 50 mg via INTRAVENOUS

## 2011-12-09 MED ORDER — FAMOTIDINE IN NACL 20-0.9 MG/50ML-% IV SOLN
20.0000 mg | Freq: Once | INTRAVENOUS | Status: AC
  Administered 2011-12-09: 20 mg via INTRAVENOUS

## 2011-12-09 NOTE — Patient Instructions (Signed)
Patient aware of next appointment; discharged home with no complaints. 

## 2011-12-12 ENCOUNTER — Encounter: Payer: Self-pay | Admitting: Oncology

## 2011-12-12 NOTE — Progress Notes (Signed)
Put [employer] cancer forms on nurse's desk.

## 2011-12-13 ENCOUNTER — Encounter: Payer: Self-pay | Admitting: Oncology

## 2011-12-13 NOTE — Progress Notes (Signed)
Faxed [employer] cancer policy to [employer] @ [phone]; put originals in registration desk.

## 2011-12-14 ENCOUNTER — Ambulatory Visit (HOSPITAL_BASED_OUTPATIENT_CLINIC_OR_DEPARTMENT_OTHER): Payer: 59

## 2011-12-14 VITALS — BP 123/84 | HR 79 | Temp 97.1°F

## 2011-12-14 DIAGNOSIS — C569 Malignant neoplasm of unspecified ovary: Secondary | ICD-10-CM

## 2011-12-14 DIAGNOSIS — Z5189 Encounter for other specified aftercare: Secondary | ICD-10-CM

## 2011-12-14 MED ORDER — FILGRASTIM 300 MCG/0.5ML IJ SOLN
300.0000 ug | Freq: Once | INTRAMUSCULAR | Status: AC
  Administered 2011-12-14: 300 ug via SUBCUTANEOUS
  Filled 2011-12-14: qty 0.5

## 2011-12-15 ENCOUNTER — Ambulatory Visit (HOSPITAL_BASED_OUTPATIENT_CLINIC_OR_DEPARTMENT_OTHER): Payer: 59

## 2011-12-15 VITALS — BP 114/76 | HR 93 | Temp 97.9°F

## 2011-12-15 DIAGNOSIS — Z5189 Encounter for other specified aftercare: Secondary | ICD-10-CM

## 2011-12-15 DIAGNOSIS — C569 Malignant neoplasm of unspecified ovary: Secondary | ICD-10-CM

## 2011-12-15 MED ORDER — FILGRASTIM 300 MCG/0.5ML IJ SOLN
300.0000 ug | Freq: Once | INTRAMUSCULAR | Status: AC
  Administered 2011-12-15: 300 ug via SUBCUTANEOUS
  Filled 2011-12-15: qty 0.5

## 2011-12-16 ENCOUNTER — Ambulatory Visit (HOSPITAL_BASED_OUTPATIENT_CLINIC_OR_DEPARTMENT_OTHER): Payer: 59

## 2011-12-16 VITALS — BP 132/78 | HR 83 | Temp 97.1°F

## 2011-12-16 DIAGNOSIS — Z5189 Encounter for other specified aftercare: Secondary | ICD-10-CM

## 2011-12-16 DIAGNOSIS — C569 Malignant neoplasm of unspecified ovary: Secondary | ICD-10-CM

## 2011-12-16 MED ORDER — FILGRASTIM 300 MCG/0.5ML IJ SOLN
300.0000 ug | Freq: Once | INTRAMUSCULAR | Status: AC
  Administered 2011-12-16: 300 ug via SUBCUTANEOUS
  Filled 2011-12-16: qty 0.5

## 2011-12-21 ENCOUNTER — Ambulatory Visit: Payer: No Typology Code available for payment source

## 2011-12-23 ENCOUNTER — Ambulatory Visit: Payer: 59

## 2011-12-26 ENCOUNTER — Ambulatory Visit
Admission: RE | Admit: 2011-12-26 | Discharge: 2011-12-26 | Disposition: A | Discharge status: Home / Self Care | Payer: 59 | Source: Ambulatory Visit | Attending: Oncology | Admitting: Oncology

## 2011-12-26 DIAGNOSIS — Z1231 Encounter for screening mammogram for malignant neoplasm of breast: Secondary | ICD-10-CM

## 2011-12-28 ENCOUNTER — Ambulatory Visit (HOSPITAL_BASED_OUTPATIENT_CLINIC_OR_DEPARTMENT_OTHER): Payer: 59 | Admitting: Oncology

## 2011-12-28 ENCOUNTER — Telehealth: Payer: Self-pay | Admitting: *Deleted

## 2011-12-28 ENCOUNTER — Encounter: Payer: Self-pay | Admitting: Oncology

## 2011-12-28 ENCOUNTER — Telehealth: Payer: Self-pay | Admitting: Oncology

## 2011-12-28 ENCOUNTER — Ambulatory Visit (HOSPITAL_BASED_OUTPATIENT_CLINIC_OR_DEPARTMENT_OTHER): Payer: 59 | Admitting: Lab

## 2011-12-28 VITALS — BP 124/74 | HR 74 | Temp 97.9°F | Ht 66.0 in | Wt 152.5 lb

## 2011-12-28 DIAGNOSIS — C569 Malignant neoplasm of unspecified ovary: Secondary | ICD-10-CM

## 2011-12-28 DIAGNOSIS — Z803 Family history of malignant neoplasm of breast: Secondary | ICD-10-CM

## 2011-12-28 LAB — COMPREHENSIVE METABOLIC PANEL
ALT: 18 U/L (ref 0–35)
Albumin: 4.3 g/dL (ref 3.5–5.2)
CO2: 30 mEq/L (ref 19–32)
Calcium: 9.2 mg/dL (ref 8.4–10.5)
Chloride: 107 mEq/L (ref 96–112)
Glucose, Bld: 66 mg/dL — ABNORMAL LOW (ref 70–99)
Potassium: 4 mEq/L (ref 3.5–5.3)
Sodium: 143 mEq/L (ref 135–145)
Total Bilirubin: 0.4 mg/dL (ref 0.3–1.2)
Total Protein: 5.9 g/dL — ABNORMAL LOW (ref 6.0–8.3)

## 2011-12-28 LAB — CBC WITH DIFFERENTIAL/PLATELET
BASO%: 1.7 % (ref 0.0–2.0)
Eosinophils Absolute: 0 10*3/uL (ref 0.0–0.5)
LYMPH%: 45.1 % (ref 14.0–49.7)
MONO#: 0.3 10*3/uL (ref 0.1–0.9)
NEUT#: 1.3 10*3/uL — ABNORMAL LOW (ref 1.5–6.5)
Platelets: 150 10*3/uL (ref 145–400)
RBC: 3.83 10*6/uL (ref 3.70–5.45)
WBC: 2.9 10*3/uL — ABNORMAL LOW (ref 3.9–10.3)
lymph#: 1.3 10*3/uL (ref 0.9–3.3)

## 2011-12-28 LAB — CA 125: CA 125: 4.9 U/mL (ref 0.0–30.2)

## 2011-12-28 NOTE — Telephone Encounter (Signed)
Notified patient of lab results. "WBC still a little low, but not neutropenic. Hgb and Plts good. She is likely feeling tired with the lower WBC, just needs more time off chemo".

## 2011-12-28 NOTE — Telephone Encounter (Signed)
Gv pt appt for oct2013.  scheduled ct scan for 07/15 @ [facility]

## 2011-12-28 NOTE — Patient Instructions (Signed)
We will let you know about labs from today
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
## 2011-12-29 ENCOUNTER — Encounter: Payer: Self-pay | Admitting: Oncology

## 2011-12-29 ENCOUNTER — Telehealth: Payer: Self-pay

## 2011-12-29 NOTE — Telephone Encounter (Signed)
Told Ms. Sitton that her c-met and ca-125 were good yesterday.

## 2011-12-29 NOTE — Progress Notes (Signed)
[facility] END OF TREATMENT   Name: Jordan Golden Date: 12/29/2011 MRN: [id number] DOB: 1961-04-26   TREATMENT DATES:    REFERRING PHYSICIAN: No ref. provider found   DIAGNOSIS:  serous ovarian carcinoma   STAGE AT START OF TREATMENT: IA   INTENT:curative   DRUGS OR REGIMENS GIVEN: taxol/ carboplatin   MAJOR TOXICITIES: neutropenia   REASON TREATMENT STOPPED: planned course completed   PERFORMANCE STATUS AT END: 0 - 1   ONGOING PROBLEMS: fatigue   FOLLOW UP PLANS: CT AP and gyn oncology in one month; medical oncology 4 months

## 2012-01-16 ENCOUNTER — Ambulatory Visit (HOSPITAL_COMMUNITY)
Admission: RE | Admit: 2012-01-16 | Discharge: 2012-01-16 | Disposition: A | Discharge status: Home / Self Care | Payer: 59 | Source: Ambulatory Visit | Attending: Oncology | Admitting: Oncology

## 2012-01-16 ENCOUNTER — Encounter (HOSPITAL_COMMUNITY): Payer: Self-pay

## 2012-01-16 DIAGNOSIS — Z90710 Acquired absence of both cervix and uterus: Secondary | ICD-10-CM | POA: Insufficient documentation

## 2012-01-16 DIAGNOSIS — S22009A Unspecified fracture of unspecified thoracic vertebra, initial encounter for closed fracture: Secondary | ICD-10-CM | POA: Insufficient documentation

## 2012-01-16 DIAGNOSIS — K7689 Other specified diseases of liver: Secondary | ICD-10-CM | POA: Insufficient documentation

## 2012-01-16 DIAGNOSIS — C569 Malignant neoplasm of unspecified ovary: Secondary | ICD-10-CM | POA: Insufficient documentation

## 2012-01-16 DIAGNOSIS — K429 Umbilical hernia without obstruction or gangrene: Secondary | ICD-10-CM | POA: Insufficient documentation

## 2012-01-16 DIAGNOSIS — X58XXXA Exposure to other specified factors, initial encounter: Secondary | ICD-10-CM | POA: Insufficient documentation

## 2012-01-16 DIAGNOSIS — Z9079 Acquired absence of other genital organ(s): Secondary | ICD-10-CM | POA: Insufficient documentation

## 2012-01-16 DIAGNOSIS — Z9221 Personal history of antineoplastic chemotherapy: Secondary | ICD-10-CM | POA: Insufficient documentation

## 2012-01-16 DIAGNOSIS — R911 Solitary pulmonary nodule: Secondary | ICD-10-CM | POA: Insufficient documentation

## 2012-01-16 DIAGNOSIS — N200 Calculus of kidney: Secondary | ICD-10-CM | POA: Insufficient documentation

## 2012-01-16 HISTORY — DX: Malignant (primary) neoplasm, unspecified: C80.1

## 2012-01-16 MED ORDER — IOHEXOL 300 MG/ML  SOLN
100.0000 mL | Freq: Once | INTRAMUSCULAR | Status: AC | PRN
  Administered 2012-01-16: 100 mL via INTRAVENOUS

## 2012-01-18 ENCOUNTER — Ambulatory Visit: Payer: 59 | Attending: Gynecologic Oncology | Admitting: Gynecologic Oncology

## 2012-01-18 ENCOUNTER — Encounter: Payer: Self-pay | Admitting: Gynecologic Oncology

## 2012-01-18 VITALS — BP 100/70 | HR 62 | Temp 97.8°F | Resp 16 | Ht 66.0 in | Wt 152.3 lb

## 2012-01-18 DIAGNOSIS — C569 Malignant neoplasm of unspecified ovary: Secondary | ICD-10-CM

## 2012-01-18 DIAGNOSIS — Z90710 Acquired absence of both cervix and uterus: Secondary | ICD-10-CM | POA: Insufficient documentation

## 2012-01-18 DIAGNOSIS — Z803 Family history of malignant neoplasm of breast: Secondary | ICD-10-CM | POA: Insufficient documentation

## 2012-01-18 DIAGNOSIS — Z87442 Personal history of urinary calculi: Secondary | ICD-10-CM | POA: Insufficient documentation

## 2012-01-18 DIAGNOSIS — Z8249 Family history of ischemic heart disease and other diseases of the circulatory system: Secondary | ICD-10-CM | POA: Insufficient documentation

## 2012-01-18 DIAGNOSIS — Z9079 Acquired absence of other genital organ(s): Secondary | ICD-10-CM | POA: Insufficient documentation

## 2012-01-18 DIAGNOSIS — Z888 Allergy status to other drugs, medicaments and biological substances status: Secondary | ICD-10-CM | POA: Insufficient documentation

## 2012-01-18 DIAGNOSIS — Z833 Family history of diabetes mellitus: Secondary | ICD-10-CM | POA: Insufficient documentation

## 2012-01-18 DIAGNOSIS — C549 Malignant neoplasm of corpus uteri, unspecified: Secondary | ICD-10-CM | POA: Insufficient documentation

## 2012-01-18 DIAGNOSIS — Z9221 Personal history of antineoplastic chemotherapy: Secondary | ICD-10-CM | POA: Insufficient documentation

## 2012-01-18 DIAGNOSIS — M810 Age-related osteoporosis without current pathological fracture: Secondary | ICD-10-CM | POA: Insufficient documentation

## 2012-01-18 NOTE — Patient Instructions (Signed)
Follow up Dr. Darrold Span as scheduled in 3 months.
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
## 2012-03-12 ENCOUNTER — Telehealth: Payer: Self-pay
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
## 2012-03-15 ENCOUNTER — Ambulatory Visit: Payer: No Typology Code available for payment source | Admitting: Gynecologic Oncology

## 2012-04-23 ENCOUNTER — Other Ambulatory Visit (HOSPITAL_BASED_OUTPATIENT_CLINIC_OR_DEPARTMENT_OTHER): Payer: 59 | Admitting: Lab

## 2012-04-23 ENCOUNTER — Ambulatory Visit (HOSPITAL_BASED_OUTPATIENT_CLINIC_OR_DEPARTMENT_OTHER): Payer: 59 | Admitting: Oncology

## 2012-04-23 ENCOUNTER — Telehealth: Payer: Self-pay | Admitting: Oncology

## 2012-04-23 ENCOUNTER — Encounter: Payer: Self-pay | Admitting: Oncology

## 2012-04-23 VITALS — BP 116/73 | HR 66 | Temp 97.4°F | Resp 20 | Ht 66.0 in | Wt 159.4 lb

## 2012-04-23 DIAGNOSIS — C569 Malignant neoplasm of unspecified ovary: Secondary | ICD-10-CM

## 2012-04-23 DIAGNOSIS — Z803 Family history of malignant neoplasm of breast: Secondary | ICD-10-CM

## 2012-04-23 LAB — CBC WITH DIFFERENTIAL/PLATELET
BASO%: 1.8 % (ref 0.0–2.0)
EOS%: 1.6 % (ref 0.0–7.0)
MCH: 32.1 pg (ref 25.1–34.0)
MCHC: 33.5 g/dL (ref 31.5–36.0)
MONO#: 0.3 10*3/uL (ref 0.1–0.9)
NEUT%: 40.2 % (ref 38.4–76.8)
RBC: 4.07 10*6/uL (ref 3.70–5.45)
RDW: 12.9 % (ref 11.2–14.5)
WBC: 4.1 10*3/uL (ref 3.9–10.3)
lymph#: 2 10*3/uL (ref 0.9–3.3)

## 2012-04-23 LAB — COMPREHENSIVE METABOLIC PANEL (CC13)
ALT: 15 U/L (ref 0–55)
AST: 12 U/L (ref 5–34)
Albumin: 4.1 g/dL (ref 3.5–5.0)
CO2: 26 mEq/L (ref 22–29)
Calcium: 9.5 mg/dL (ref 8.4–10.4)
Chloride: 108 mEq/L — ABNORMAL HIGH (ref 98–107)
Creatinine: 0.8 mg/dL (ref 0.6–1.1)
Potassium: 4.4 mEq/L (ref 3.5–5.1)
Total Protein: 6.1 g/dL — ABNORMAL LOW (ref 6.4–8.3)
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
## 2012-04-23 NOTE — Telephone Encounter (Signed)
Printed and gv pt appt schedule for OCT, Dec, Jan, and April.

## 2012-04-23 NOTE — Patient Instructions (Signed)
Call if you do not hear from Dr Precious Reel office within a couple of days of getting the CT

## 2012-04-25 ENCOUNTER — Ambulatory Visit (HOSPITAL_COMMUNITY)
Admission: RE | Admit: 2012-04-25 | Discharge: 2012-04-25 | Disposition: A | Discharge status: Home / Self Care | Payer: 59 | Source: Ambulatory Visit | Attending: Oncology | Admitting: Oncology

## 2012-04-25 ENCOUNTER — Telehealth: Payer: Self-pay | Admitting: Oncology

## 2012-04-25 ENCOUNTER — Telehealth: Payer: Self-pay

## 2012-04-25 DIAGNOSIS — Z09 Encounter for follow-up examination after completed treatment for conditions other than malignant neoplasm: Secondary | ICD-10-CM | POA: Insufficient documentation

## 2012-04-25 DIAGNOSIS — C569 Malignant neoplasm of unspecified ovary: Secondary | ICD-10-CM

## 2012-04-25 MED ORDER — IOHEXOL 300 MG/ML  SOLN
100.0000 mL | Freq: Once | INTRAMUSCULAR | Status: AC | PRN
  Administered 2012-04-25: 100 mL via INTRAVENOUS

## 2012-04-25 NOTE — Telephone Encounter (Signed)
Jordan Golden is to call the physical therapy group in [place]  [name]  .  Cancelled appt. with [facility] outpatient rehab. For 05-02-12 with [name].

## 2012-04-25 NOTE — Telephone Encounter (Signed)
Jordan Golden called to let this office know patient set up for appt.  She did not know who called to make appointment from this office.

## 2012-04-25 NOTE — Telephone Encounter (Signed)
Per Dr. Shelby Dubin PT referral cancelled and not needed from this office.

## 2012-05-02 ENCOUNTER — Ambulatory Visit: Payer: 59 | Admitting: Physical Therapy

## 2012-05-08 ENCOUNTER — Telehealth: Payer: Self-pay

## 2012-05-08 ENCOUNTER — Other Ambulatory Visit: Payer: Self-pay

## 2012-05-08 DIAGNOSIS — C569 Malignant neoplasm of unspecified ovary: Secondary | ICD-10-CM
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
## 2012-05-11 ENCOUNTER — Other Ambulatory Visit (HOSPITAL_BASED_OUTPATIENT_CLINIC_OR_DEPARTMENT_OTHER): Payer: 59 | Admitting: Lab

## 2012-05-11 DIAGNOSIS — C569 Malignant neoplasm of unspecified ovary: Secondary | ICD-10-CM

## 2012-05-11 LAB — CA 125: CA 125: 4.3 U/mL (ref 0.0–30.2)

## 2012-05-14 ENCOUNTER — Telehealth: Payer: Self-pay

## 2012-05-14 NOTE — Telephone Encounter (Signed)
Spoke with Ms. Jordan Golden  and told her that her ca-125 was good as noted below by Dr. Darrold Span at 4.3 on 05-11-12.

## 2012-05-14 NOTE — Telephone Encounter (Signed)
Message copied by Lorine Bears on Mon May 14, 2012 12:58 PM ------      Message from: Reece Packer      Created: Sat May 12, 2012  6:38 AM       Labs seen and need follow up: please let her know this was good, 4.3

## 2012-06-11 ENCOUNTER — Encounter: Payer: Self-pay | Admitting: Genetic Counselor

## 2012-06-11 ENCOUNTER — Ambulatory Visit (HOSPITAL_BASED_OUTPATIENT_CLINIC_OR_DEPARTMENT_OTHER): Payer: 59 | Admitting: Genetic Counselor

## 2012-06-11 ENCOUNTER — Ambulatory Visit: Payer: 59 | Admitting: Lab

## 2012-06-11 DIAGNOSIS — N838 Other noninflammatory disorders of ovary, fallopian tube and broad ligament: Secondary | ICD-10-CM

## 2012-06-11 DIAGNOSIS — Z803 Family history of malignant neoplasm of breast: Secondary | ICD-10-CM

## 2012-06-11 DIAGNOSIS — IMO0002 Reserved for concepts with insufficient information to code with codable children: Secondary | ICD-10-CM

## 2012-06-11 DIAGNOSIS — C569 Malignant neoplasm of unspecified ovary: Secondary | ICD-10-CM
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
## 2012-07-02 ENCOUNTER — Other Ambulatory Visit: Payer: 59 | Admitting: Lab

## 2012-07-02 DIAGNOSIS — C569 Malignant neoplasm of unspecified ovary: Secondary | ICD-10-CM

## 2012-07-02 LAB — CA 125: CA 125: 3.2 U/mL (ref 0.0–30.2)

## 2012-07-05 ENCOUNTER — Ambulatory Visit: Payer: 59 | Attending: Gynecologic Oncology | Admitting: Gynecologic Oncology

## 2012-07-05 ENCOUNTER — Encounter: Payer: Self-pay | Admitting: Genetic Counselor

## 2012-07-05 ENCOUNTER — Encounter: Payer: Self-pay | Admitting: Gynecologic Oncology

## 2012-07-05 ENCOUNTER — Telehealth: Payer: Self-pay | Admitting: Genetic Counselor

## 2012-07-05 VITALS — BP 102/80 | HR 68 | Temp 97.5°F | Resp 18 | Ht 66.0 in | Wt 160.6 lb

## 2012-07-05 DIAGNOSIS — Z90710 Acquired absence of both cervix and uterus: Secondary | ICD-10-CM | POA: Insufficient documentation

## 2012-07-05 DIAGNOSIS — C569 Malignant neoplasm of unspecified ovary: Secondary | ICD-10-CM

## 2012-07-05 DIAGNOSIS — M810 Age-related osteoporosis without current pathological fracture: Secondary | ICD-10-CM | POA: Insufficient documentation

## 2012-07-05 DIAGNOSIS — Z8249 Family history of ischemic heart disease and other diseases of the circulatory system: Secondary | ICD-10-CM | POA: Insufficient documentation

## 2012-07-05 DIAGNOSIS — Z833 Family history of diabetes mellitus: Secondary | ICD-10-CM | POA: Insufficient documentation

## 2012-07-05 DIAGNOSIS — Z9221 Personal history of antineoplastic chemotherapy: Secondary | ICD-10-CM | POA: Insufficient documentation

## 2012-07-05 DIAGNOSIS — Z803 Family history of malignant neoplasm of breast: Secondary | ICD-10-CM | POA: Insufficient documentation

## 2012-07-05 DIAGNOSIS — Z9079 Acquired absence of other genital organ(s): Secondary | ICD-10-CM | POA: Insufficient documentation

## 2012-07-05 NOTE — Patient Instructions (Signed)
Return to clinic in 6 months.
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
## 2012-07-05 NOTE — Telephone Encounter (Signed)
Revealed negative BRCA test results.  The remainder of the panel is being completed.

## 2012-07-06 ENCOUNTER — Telehealth: Payer: Self-pay

## 2012-07-06 NOTE — Telephone Encounter (Signed)
Told Jordan Golden that she is not scheduled with Dr. Darrold Span on 07-09-12.  Her appt. Is 10-24-12 at 1100 for lab with visit at 1130. This is ~91months from Dr. Edward Jolly appt.  07-05-12.  Pt. verbalized understanding.

## 2012-08-09 IMAGING — CT CT ABD-PELV W/ CM
2 of 5 series · 16 of 46 positions shown, 18 images · IV contrast (OMNIPAQUE)
Comparison: CT of abdomen and pelvis 08/25/2011.

CLINICAL DATA: Restaging of ovarian cancer after surgery and
chemotherapy.

CT ABDOMEN AND PELVIS WITH CONTRAST
TECHNIQUE: Multidetector CT imaging of the abdomen and pelvis was
performed following the standard protocol during bolus
administration of intravenous contrast.
Contrast: 100mL OMNIPAQUE IOHEXOL 300 MG/ML  SOLN

[Series 2: rtn a/p with · axial · 0.74mm/px · z∈[-630,-270]mm · 13 of 82 slices shown, 15 images]
[im 5/82  soft-tissue]
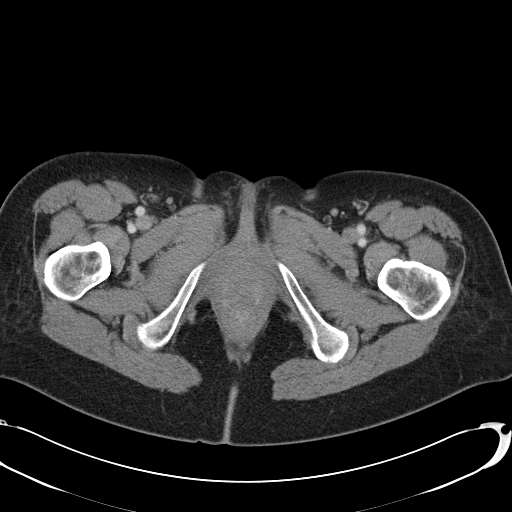
[im 5/82  bone]
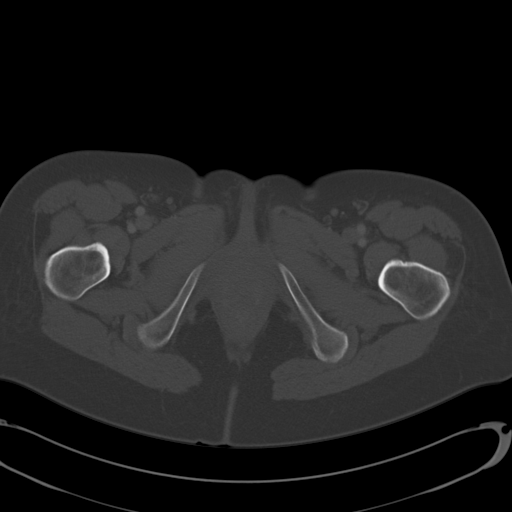
[im 13/82  soft-tissue]
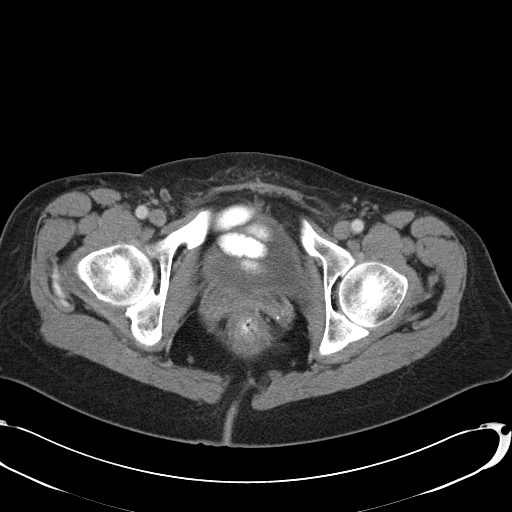
[im 18/82  soft-tissue]
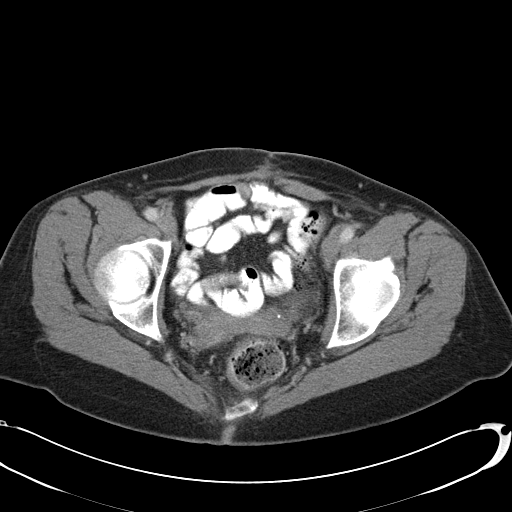
[im 22/82  soft-tissue]
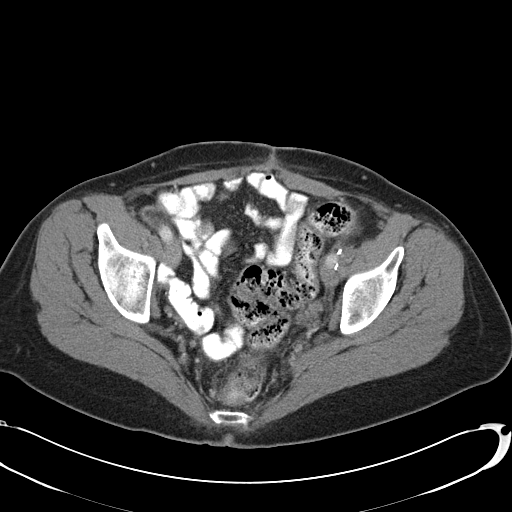
[im 30/82  soft-tissue]
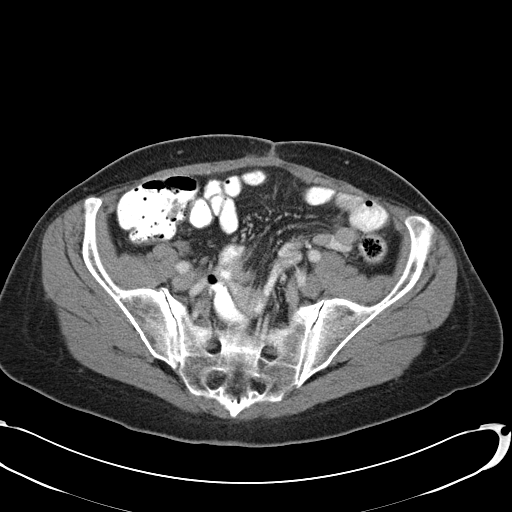
[im 35/82  soft-tissue]
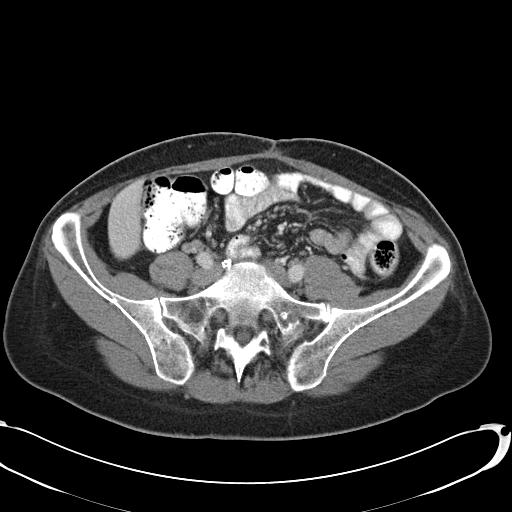
[im 43/82  soft-tissue]
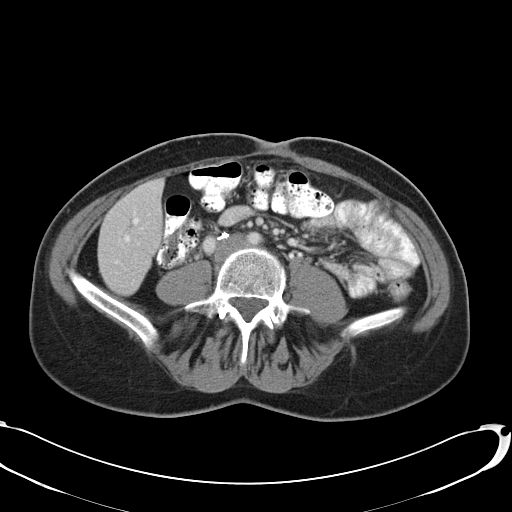
[im 47/82  soft-tissue]
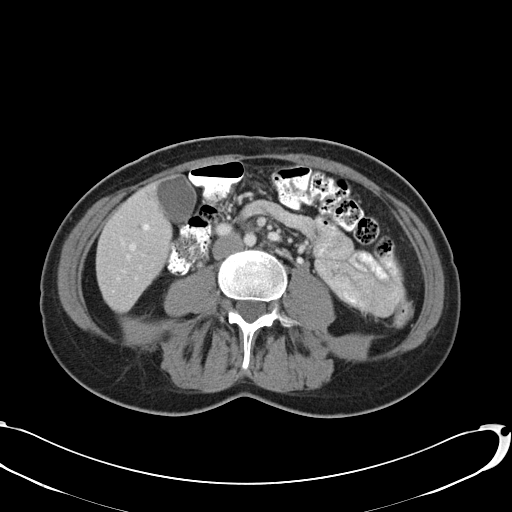
[im 52/82  soft-tissue]
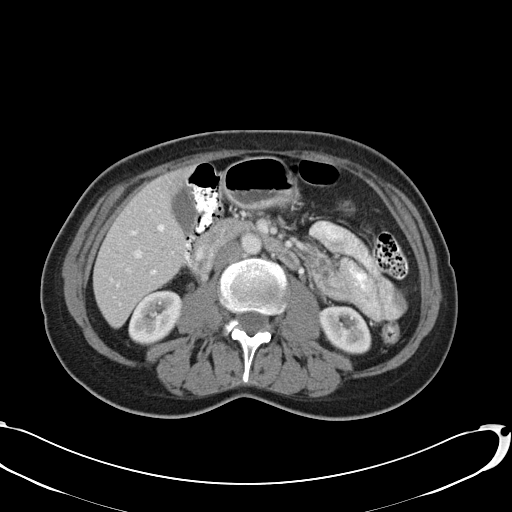
[im 52/82  bone]
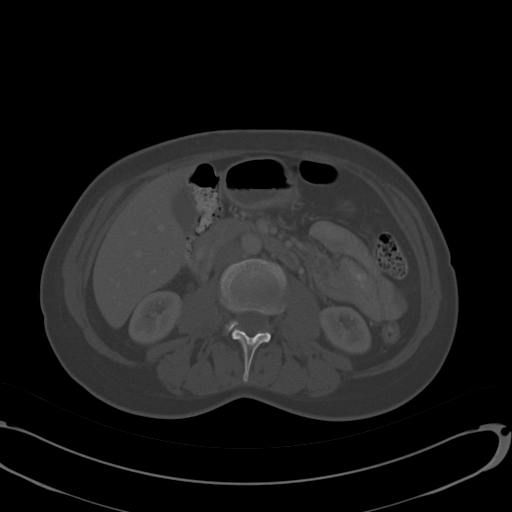
[im 60/82  soft-tissue]
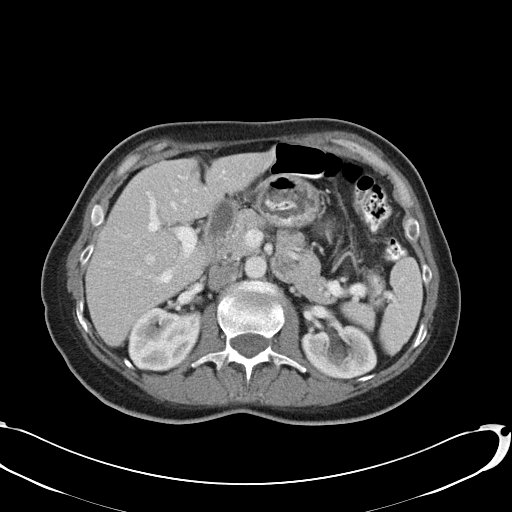
[im 64/82  soft-tissue]
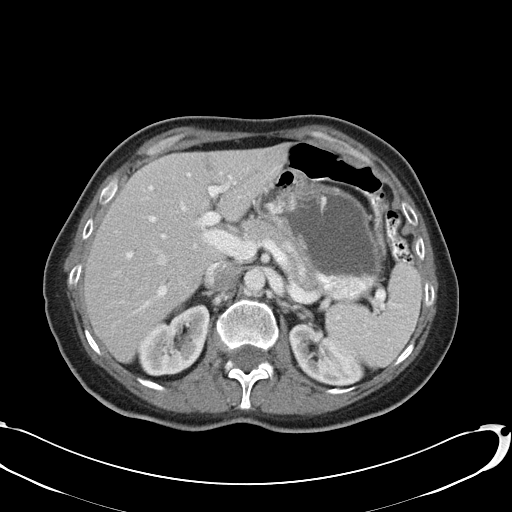
[im 69/82  soft-tissue]
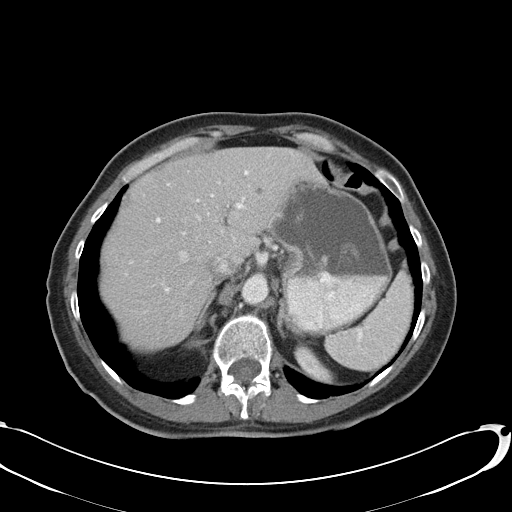
[im 77/82  soft-tissue]
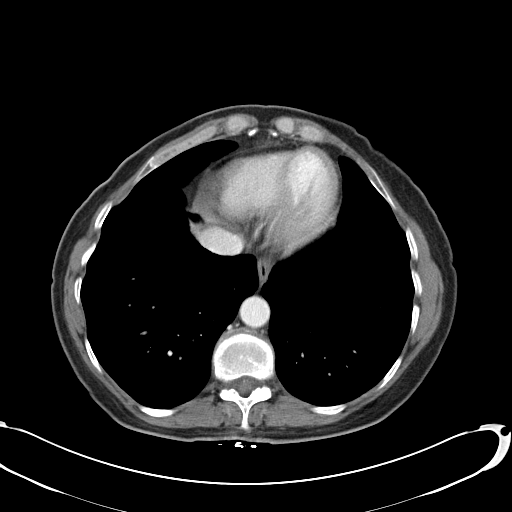

[Series 602: <mpr thick range> · coronal · 0.80mm/px · 3 of 83 slices shown]
[im 28/83  soft-tissue]
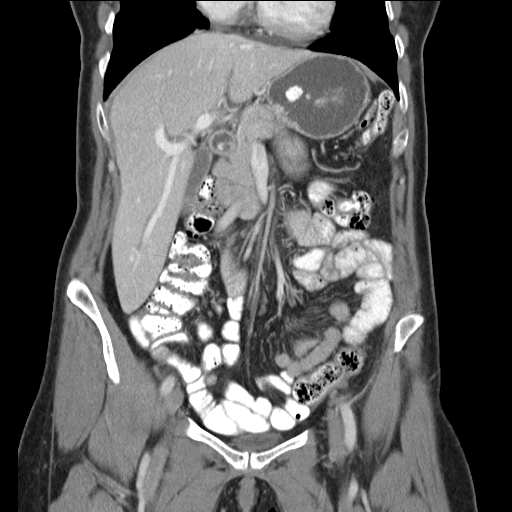
[im 37/83  soft-tissue]
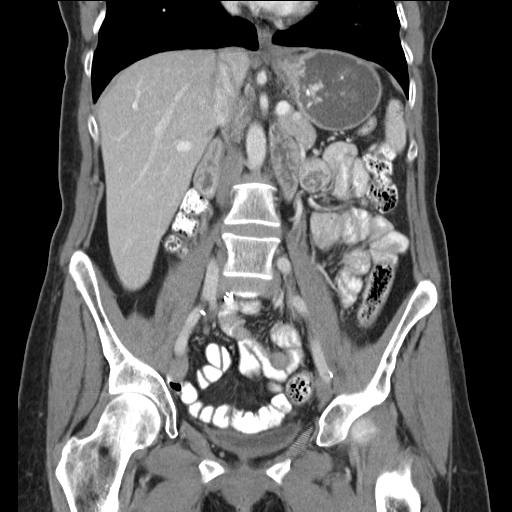
[im 46/83  soft-tissue]
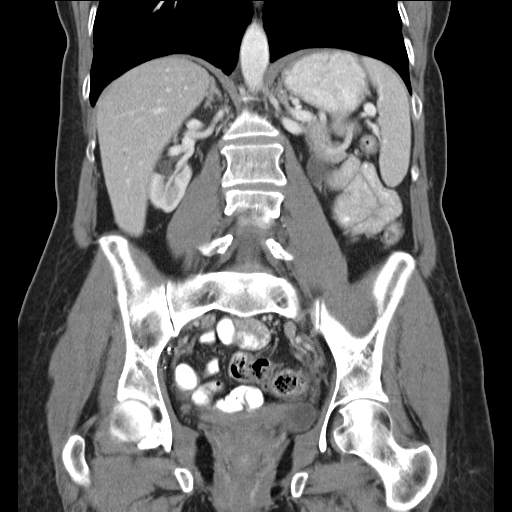

[16 of 46 positions shown; findings below may reference images not displayed]

FINDINGS: Lung Bases: 3 mm subpleural nodule in the periphery of the right
lower lobe (image 8 of series 6) is unchanged compared to
01/16/2010, and favored to be benign.  No other larger more
suspicious appearing pulmonary nodules or masses are otherwise
noted in the visualized portions of the lung bases.

Abdomen/Pelvis:  5 mm low attenuation lesion in segment 2 of the
liver is nonspecific but appears slightly larger than prior study
from 08/25/2011.  The appearance of the gallbladder, pancreas,
spleen, bilateral adrenal glands and right kidney is unremarkable.
In the lower pole collecting system of the left kidney there is a 4
mm nonobstructive calculus.

Compared to the prior examination and there has been a total
abdominal hysterectomy and bilateral salpingo-oophorectomy, as well
as omentectomy.  Along the left pelvic sidewall (image 57 of series
2) there is a focus of heterogeneously enhancing soft tissue
measuring 2.1 x 1.2 cm.  Additionally there is some soft tissue
(image 51 of series 2) along the course of the right and lateral
vein, which could represent an enlarged lymph node. No significant
volume of ascites.  No focal mesenteric soft tissue mass is
otherwise identified.  Tiny umbilical hernia incidentally noted.

Musculoskeletal: Interval development of a compression fracture at
T12 with approximately 30% loss of height anteriorly and 10% loss
of height posteriorly.  At this level there is approximate 4 mm of
retropulsion of the fracture fragments impinging slightly upon the
central spinal canal.
IMPRESSION: 1.  Status post total abdominal hysterectomy and bilateral salpingo-
oophorectomy and omentectomy with only minimal residual soft tissue
noted along the left pelvic side wall and along the course of the
right and gonadal vein.  In the setting of recent surgery, this
soft tissue is nonspecific and could represent developing scar
tissue or reactive lymphadenopathy, however, attention on follow-up
studies is recommended to exclude metastatic disease.
2.  5 mm low attenuation lesion in segment 2 of the liver is
nonspecific but does appear slightly larger than prior examination.
Attention on follow-up studies is recommended.
3.  Interval development of a compression fracture at T12, with
approximately 4 mm of retropulsion of the fracture fragments which
minimally narrows the central spinal canal, as detailed above.
There are no definite features to strongly suggest the presence of
a pathologic fracture by CT at this time.
4.  3 mm subpleural nodule the periphery of the right lower lobe is
unchanged compared 01/16/2010 and can therefore be considered
radiographically benign.

## 2012-09-14 ENCOUNTER — Encounter: Payer: Self-pay | Admitting: Genetic Counselor

## 2012-10-24 ENCOUNTER — Other Ambulatory Visit (HOSPITAL_BASED_OUTPATIENT_CLINIC_OR_DEPARTMENT_OTHER): Payer: 59 | Admitting: Lab

## 2012-10-24 ENCOUNTER — Encounter: Payer: Self-pay | Admitting: Oncology

## 2012-10-24 ENCOUNTER — Ambulatory Visit (HOSPITAL_BASED_OUTPATIENT_CLINIC_OR_DEPARTMENT_OTHER): Payer: 59 | Admitting: Oncology

## 2012-10-24 ENCOUNTER — Telehealth: Payer: Self-pay | Admitting: Oncology

## 2012-10-24 VITALS — BP 116/74 | HR 67 | Temp 97.1°F | Resp 20 | Ht 66.0 in | Wt 156.4 lb

## 2012-10-24 DIAGNOSIS — Z1231 Encounter for screening mammogram for malignant neoplasm of breast: Secondary | ICD-10-CM

## 2012-10-24 DIAGNOSIS — C569 Malignant neoplasm of unspecified ovary: Secondary | ICD-10-CM

## 2012-10-24 LAB — CBC WITH DIFFERENTIAL/PLATELET
BASO%: 0.9 % (ref 0.0–2.0)
Basophils Absolute: 0 10*3/uL (ref 0.0–0.1)
EOS%: 1.5 % (ref 0.0–7.0)
HCT: 39.8 % (ref 34.8–46.6)
HGB: 13.4 g/dL (ref 11.6–15.9)
MCH: 30.7 pg (ref 25.1–34.0)
MONO#: 0.4 10*3/uL (ref 0.1–0.9)
NEUT%: 47.8 % (ref 38.4–76.8)
RDW: 13.4 % (ref 11.2–14.5)
WBC: 4.7 10*3/uL (ref 3.9–10.3)
lymph#: 2 10*3/uL (ref 0.9–3.3)

## 2012-10-24 LAB — COMPREHENSIVE METABOLIC PANEL (CC13)
ALT: 11 U/L (ref 0–55)
AST: 12 U/L (ref 5–34)
Albumin: 4 g/dL (ref 3.5–5.0)
BUN: 20.2 mg/dL (ref 7.0–26.0)
CO2: 27 mEq/L (ref 22–29)
Calcium: 9.7 mg/dL (ref 8.4–10.4)
Chloride: 109 mEq/L — ABNORMAL HIGH (ref 98–107)
Potassium: 4 mEq/L (ref 3.5–5.1)
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
## 2012-10-24 NOTE — Patient Instructions (Signed)
3D/ tomo mammograms with next done in June 2014, because of dense breast tissue.

## 2012-12-13 ENCOUNTER — Other Ambulatory Visit: Payer: 59 | Admitting: Lab

## 2012-12-13 ENCOUNTER — Ambulatory Visit: Payer: 59 | Admitting: Gynecologic Oncology

## 2013-01-09 ENCOUNTER — Ambulatory Visit: Payer: 59

## 2013-01-30 ENCOUNTER — Other Ambulatory Visit (HOSPITAL_BASED_OUTPATIENT_CLINIC_OR_DEPARTMENT_OTHER): Payer: 59 | Admitting: Lab

## 2013-01-30 ENCOUNTER — Encounter: Payer: Self-pay | Admitting: Gynecologic Oncology

## 2013-01-30 ENCOUNTER — Ambulatory Visit: Payer: 59 | Admitting: Gynecologic Oncology

## 2013-01-30 VITALS — BP 110/64 | HR 64 | Temp 98.0°F | Resp 16 | Ht 66.0 in | Wt 154.9 lb

## 2013-01-30 DIAGNOSIS — C569 Malignant neoplasm of unspecified ovary: Secondary | ICD-10-CM

## 2013-01-30 LAB — CA 125: CA 125: 5.8 U/mL (ref 0.0–30.2)

## 2013-01-30 NOTE — Patient Instructions (Signed)
3 months with Dr. Precious Reel group and 6 months with Gyn Oncology
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
## 2013-02-18 ENCOUNTER — Other Ambulatory Visit: Payer: Self-pay | Admitting: Oncology

## 2013-02-18 DIAGNOSIS — C569 Malignant neoplasm of unspecified ovary: Secondary | ICD-10-CM

## 2013-02-20 ENCOUNTER — Telehealth: Payer: Self-pay | Admitting: Oncology

## 2013-02-21 ENCOUNTER — Telehealth: Payer: Self-pay | Admitting: Oncology

## 2013-02-21 NOTE — Telephone Encounter (Signed)
, °

## 2013-03-07 ENCOUNTER — Ambulatory Visit
Admission: RE | Admit: 2013-03-07 | Discharge: 2013-03-07 | Disposition: A | Discharge status: Home / Self Care | Payer: 59 | Source: Ambulatory Visit | Attending: Oncology | Admitting: Oncology

## 2013-03-07 DIAGNOSIS — Z1231 Encounter for screening mammogram for malignant neoplasm of breast: Secondary | ICD-10-CM

## 2013-03-14 ENCOUNTER — Other Ambulatory Visit: Payer: 59 | Admitting: Lab

## 2013-03-14 ENCOUNTER — Ambulatory Visit: Payer: 59

## 2013-03-15 ENCOUNTER — Other Ambulatory Visit: Payer: 59 | Admitting: Lab

## 2013-03-15 ENCOUNTER — Ambulatory Visit: Payer: 59

## 2013-03-31 ENCOUNTER — Other Ambulatory Visit: Payer: Self-pay | Admitting: Oncology

## 2013-04-10 ENCOUNTER — Encounter: Payer: Self-pay | Admitting: Oncology

## 2013-04-10 ENCOUNTER — Ambulatory Visit (HOSPITAL_BASED_OUTPATIENT_CLINIC_OR_DEPARTMENT_OTHER): Payer: 59 | Admitting: Oncology

## 2013-04-10 ENCOUNTER — Other Ambulatory Visit (HOSPITAL_BASED_OUTPATIENT_CLINIC_OR_DEPARTMENT_OTHER): Payer: 59 | Admitting: Lab

## 2013-04-10 ENCOUNTER — Ambulatory Visit: Payer: 59 | Admitting: Oncology

## 2013-04-10 ENCOUNTER — Telehealth: Payer: Self-pay | Admitting: *Deleted

## 2013-04-10 VITALS — BP 114/68 | HR 80 | Temp 98.2°F | Resp 18 | Ht 66.0 in | Wt 158.6 lb

## 2013-04-10 DIAGNOSIS — C569 Malignant neoplasm of unspecified ovary: Secondary | ICD-10-CM

## 2013-04-10 DIAGNOSIS — Z803 Family history of malignant neoplasm of breast: Secondary | ICD-10-CM

## 2013-04-10 LAB — CBC WITH DIFFERENTIAL/PLATELET
BASO%: 1.2 % (ref 0.0–2.0)
EOS%: 1.9 % (ref 0.0–7.0)
MCH: 31.5 pg (ref 25.1–34.0)
MCV: 92.8 fL (ref 79.5–101.0)
MONO%: 6.7 % (ref 0.0–14.0)
RBC: 4.15 10*6/uL (ref 3.70–5.45)
RDW: 13.3 % (ref 11.2–14.5)
lymph#: 2.3 10*3/uL (ref 0.9–3.3)

## 2013-04-10 LAB — COMPREHENSIVE METABOLIC PANEL (CC13)
ALT: 17 U/L (ref 0–55)
AST: 14 U/L (ref 5–34)
Alkaline Phosphatase: 57 U/L (ref 40–150)
Glucose: 96 mg/dl (ref 70–140)
Sodium: 143 mEq/L (ref 136–145)
Total Bilirubin: 0.33 mg/dL (ref 0.20–1.20)
Total Protein: 6.7 g/dL (ref 6.4–8.3)
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
## 2013-04-10 NOTE — Telephone Encounter (Signed)
Pt is aware to call Dr. Duard Brady in Nov. For appt. She is also aware that soon as the template open for LL 2015 we will call her w/ appt. Printed order ...td

## 2013-04-11 LAB — CA 125: CA 125: 3.9 U/mL (ref 0.0–30.2)

## 2013-04-12 ENCOUNTER — Ambulatory Visit: Payer: 59 | Admitting: Oncology

## 2013-05-09 ENCOUNTER — Other Ambulatory Visit: Payer: Self-pay

## 2013-07-24 ENCOUNTER — Ambulatory Visit: Payer: 59 | Attending: Gynecologic Oncology | Admitting: Gynecologic Oncology

## 2013-07-24 ENCOUNTER — Encounter: Payer: Self-pay | Admitting: Gynecologic Oncology

## 2013-07-24 ENCOUNTER — Ambulatory Visit: Payer: 59

## 2013-07-24 ENCOUNTER — Telehealth: Payer: Self-pay | Admitting: Oncology

## 2013-07-24 VITALS — BP 105/64 | HR 65 | Temp 98.3°F | Resp 16 | Ht 66.0 in | Wt 153.3 lb

## 2013-07-24 DIAGNOSIS — C569 Malignant neoplasm of unspecified ovary: Secondary | ICD-10-CM

## 2013-07-24 DIAGNOSIS — Z9221 Personal history of antineoplastic chemotherapy: Secondary | ICD-10-CM | POA: Insufficient documentation

## 2013-07-24 DIAGNOSIS — Z9079 Acquired absence of other genital organ(s): Secondary | ICD-10-CM | POA: Insufficient documentation

## 2013-07-24 DIAGNOSIS — Z1371 Encounter for nonprocreative screening for genetic disease carrier status: Secondary | ICD-10-CM | POA: Insufficient documentation

## 2013-07-24 DIAGNOSIS — M810 Age-related osteoporosis without current pathological fracture: Secondary | ICD-10-CM | POA: Insufficient documentation

## 2013-07-24 DIAGNOSIS — Z90710 Acquired absence of both cervix and uterus: Secondary | ICD-10-CM | POA: Insufficient documentation

## 2013-07-24 LAB — CA 125: CA 125: 4.1 U/mL (ref 0.0–30.2)

## 2013-07-24 NOTE — Progress Notes (Signed)
Consult Note: Gyn-Onc  Jordan Golden 53 y.o. female  CC:  Chief Complaint  Patient presents with  . Ovarian Cancer    follow up     HPI: Patient is seen, alone for visit, in follow up of her adjuvant taxol/ carboplatin chemotherapy for IA high grade ovarian carcinoma. She had third and last planned cycle of taxol/carboplatin on 12-09-11, with 3 days of neupogen after that treatment.   History is of pelvic mass, which patient initially appreciated herself in Feb 2013, then evaluation by Dr.Romine which included [place], CT and CA 125 of 15. Ultrasound at that time revealed a 10.2 x 3.1 x 4.7 cm uterus. There was a complex cystic mass measuring approximately 10 cm. She had a CT scan of the abdomen and pelvis that revealed in mid pelvic mass measuring 16 x 9.7 x 12.0 cm.   Surgery was by [facility] 09-20-2011 withTAH/BSO/omentectomy/bilateral pelvic and periaortic lymphadenectomy for FIGO 1A high grade serous carcinoma of ovary, 21 cm with intact capsule and arising in background of borderline serous tumor. Operative findings included a mobile left ovarian mass measuring 16 cm that was non-adherent. Frozen section was consistent with a low malignant potential ovarian tumor within nodule of adenocarcinoma.  Final pathology revealed:  Diagnosis  1. Adnexa - ovary +/- tube, neoplastic, left ovary and fallopian  - INVASIVE HIGH GRADE SEROUS CARCINOMA ARISING IN A BACKGROUND OF BORDERLINE SEROUS TUMOR, 21 CM, WITH INTACT CAPSULE. PLEASE SEE ONCOLOGY  TEMPLATE FOR DETAIL.  2. Uterus and cervix, with right tube and ovary  - LEIOMYOMATA.  - ENDOMETRIUM: BENIGN WEAKLY PROLIFERATIVE ENDOMETRIUM, NO ATYPIA, HYPERPLASIA OR MALIGNANCY.  - CERVIX: BENIGN SQUAMOUS MUCOSA AND ENDOCERVICAL MUCOSA, NO DYSPLASIA OR MALIGNANCY.  - RIGHT OVARY: BENIGN OVARIAN TISSUE WITH BENIGN SEROUS CYSTS, NO EVIDENCE OF ENDOMETRIOSIS, ATYPIA OR MALIGNANCY.  - RIGHT FALLOPIAN TUBE: NO PATHOLOGIC ABNORMALITIES.  3. Cul-de-sac biopsy,  anterior  - BENIGN FIBROADIPOSE TISSUE, NO EVIDENCE OF MALIGNANCY.  4. Cul-de-sac biopsy, posterior  - BENIGN FIBROADIPOSE TISSUE, NO EVIDENCE OF MALIGNANCY.  5. Soft tissue, biopsy, right pelvic side wall  - BENIGN FIBROADIPOSE TISSUE, NO EVIDENCE OF MALIGNANCY.  6. Lymph nodes, regional resection, right pelvic  - SEVEN LYMPH NODES, NEGATIVE FOR METASTATIC CARCINOMA (0/7).  7. Lymph node, biopsy, left pelvic  - ONE LYMPH NODE, NEGATIVE FOR METASTATIC CARCINOMA (0/1).  8. Lymph node, biopsy, para aortic  - FIVE LYMPH NODES, NEGATIVE FOR METASTATIC CARCINOMA (0/5).  9. Soft tissue, biopsy, left para colic gutter  - BENIGN FIBROADIPOSE TISSUE, NO EVIDENCE OF MALIGNANCY.  10. Soft tissue, biopsy, right para colic gutter  - BENIGN FIBROADIPOSE TISSUE, NO EVIDENCE OF MALIGNANCY.  11. Omentum, resection for tumor  -MATURE ADIPOSE TISSUE, NO EVIDENCE OF MALIGNANCY.  - FOUR LYMPH NODES, NEGATIVE FOR METASTATIC CARCINOMA (0/4)  12. Diaphragm, biopsy  - BENIGN FIBROADIPOSE TISSUE WITH ASSOCIATED MILD CHRONIC INFLAMMATION, NO EVIDENCE OF MALIGNANCY.  13. Soft tissue, biopsy, left pelvic side wall  - BENIGN FIBROADIPOSE TISSUE, NO EVIDENCE OF MALIGNANCY.    She had second opinion consultation with Dr.Roland Barrett prior to deciding to proceed with chemotherapy. She completed 3 cycles of paclitaxel and carboplatin from 10/19/2011 through 12/09/2011. She is negative for BRCA testing.  Interval History:  I last saw her in July 2014. She was seen by Dr. Marko Plume in October 2014. Her CA 125 at that time was 3.9. She's been doing very well she colonoscopy in November 2000 4T with recommended follow up in 10 years. Her last mammogram was in  June with repeat imaging in June of this coming year. There are no new medical problems and her family. She her husband and be traveling to the [place] on a cruise in April.  Review of Systems:  Constitutional: Denies fever. Skin: No rash, sores, jaundice,  itching, or dryness.  Cardiovascular: No chest pain, shortness of breath, or edema  Pulmonary: No cough or wheeze.  Gastro Intestinal: No nausea, vomiting, constipation, or diarrhea reported. No bright red blood per rectum or change in bowel movement.  Genitourinary: No frequency, urgency, or dysuria.  Denies vaginal bleeding and discharge.  Musculoskeletal: No myalgia, arthralgia, joint swelling or pain.  Neurologic: No weakness, numbness, or change in gait.  Psychology: Feeling "really great"    Current Meds:  Outpatient Encounter Prescriptions as of 07/24/2013  Medication Sig  . Calcium Carbonate-Vitamin D (CALCIUM + D PO) Take 1 tablet by mouth daily.  . Cholecalciferol (VITAMIN D-3) 1000 UNITS CAPS Take 1 capsule by mouth 2 (two) times daily.  . Multiple Vitamins-Minerals (MULTIVITAMIN WITH MINERALS) tablet Take 1 tablet by mouth every morning.   Marland Kitchen OVER THE COUNTER MEDICATION Essential Oils Cellular Vitality Complex 1 tab daily    Allergy:  Allergies  Allergen Reactions  . Ultram [Tramadol Hcl] Nausea And Vomiting    Passed out    Social Hx:   History   Social History  . Marital Status: Married    Spouse Name: N/A    Number of Children: N/A  . Years of Education: N/A   Occupational History  . Not on file.   Social History Main Topics  . Smoking status: Never Smoker   . Smokeless tobacco: Never Used  . Alcohol Use: No  . Drug Use: No  . Sexual Activity: Not on file   Other Topics Concern  . Not on file   Social History Narrative  . No narrative on file    Past Surgical Hx:  Past Surgical History  Procedure Laterality Date  . Brain surgery  1996    Due to brain injury  . Cesarean section  1995  . Appendectomy    . Laparotomy  09/20/2011    Procedure: EXPLORATORY LAPAROTOMY;  Surgeon: Janie Morning, MD PHD;  Location: [facility];  Service: Gynecology;  Laterality: N/A;  . Abdominal hysterectomy  09/20/2011    Procedure: HYSTERECTOMY ABDOMINAL;  Surgeon: Janie Morning, MD PHD;  Location: [facility];  Service: Gynecology;  Laterality: N/A;  Total  . Salpingoophorectomy  09/20/2011    Procedure: SALPINGO OOPHERECTOMY;  Surgeon: Janie Morning, MD PHD;  Location: [facility];  Service: Gynecology;  Laterality: Bilateral;    Past Medical Hx:  Past Medical History  Diagnosis Date  . Ovarian mass   . Osteoporosis   . Kidney stones   . OVARIAN CA dx' d7/2013    Oncology Hx:    Ovary cancer   09/27/2011 Initial Diagnosis Ovary cancer   10/19/2011 - 12/09/2011 Chemotherapy 3 cycles paclitaxel and carboplatin    Family Hx:  Family History  Problem Relation Age of Onset  . Breast cancer Mother 38  . Heart disease Father   . Diabetes Sister   . Breast cancer Maternal Aunt     diagnosed over 80; had 5 breast primaries  . Breast cancer Maternal Aunt     over age 38; maternal half sister    Vitals:  Blood pressure 105/64, pulse 65, temperature 98.3 F (36.8 C), temperature source Oral, resp. rate 16, height 5' 6"  (1.676 m), weight 153  lb 4.8 oz (69.536 kg).  Physical Exam:  Well-nourished well-developed female in no acute distress.   Neck: Supple, no lymphadenopathy no thyromegaly.   Lungs: Clear to auscultation bilaterally.   Cardiovascular: Regular rate and rhythm.   Abdomen: Shows well-healed vertical midline incision. Abdomen is soft, nontender, nondistended. There is no evidence of an incisional hernia. There is no fluid wave. There is no hepato-splenomegaly. There are no masses. There may be a small diastasis at the level of the umbilicus but no distinct hernia.  Groins: No lymphadenopathy.   Extremities: No edema.   Pelvic external genitalia within normal limits. There is no nodularity. Rectal confirms the above.   Assessment/Plan:  53 year old with a stage IA grade 3 serious high grade ovarian carcinoma has completed 3 cycles of paclitaxel and carboplatin with her last cycle of chemotherapy been on December 09, 2011. We'll followup in results  of her CA 125 from today.  Plan:She has follow up scheduled with Dr. Marko Plume in 3 months and will return to see [place] in 6 months. She is up-to-date on her mammograms.     Nancy Marus A., MD 07/24/2013, 2:49 PM

## 2013-07-24 NOTE — Patient Instructions (Signed)
Follow up with Dr. Marko Plume in 3 months and return to see GYN oncology in 6 months. At that time you'll be 2 years after completion of your treatment and your visits can be spread out.

## 2013-07-26 ENCOUNTER — Telehealth: Payer: Self-pay | Admitting: Gynecologic Oncology

## 2013-07-26 NOTE — Telephone Encounter (Signed)
Patient informed of CA 125 results.  No concerns voiced. 

## 2013-07-31 NOTE — Progress Notes (Signed)
Reviewed personally.  M. Suzanne Felesha Moncrieffe, MD.  

## 2013-10-19 ENCOUNTER — Other Ambulatory Visit: Payer: Self-pay | Admitting: Oncology

## 2013-10-21 ENCOUNTER — Encounter: Payer: Self-pay | Admitting: Oncology

## 2013-10-21 ENCOUNTER — Other Ambulatory Visit (HOSPITAL_BASED_OUTPATIENT_CLINIC_OR_DEPARTMENT_OTHER): Payer: 59

## 2013-10-21 ENCOUNTER — Ambulatory Visit (HOSPITAL_BASED_OUTPATIENT_CLINIC_OR_DEPARTMENT_OTHER): Payer: 59 | Admitting: Oncology

## 2013-10-21 ENCOUNTER — Telehealth: Payer: Self-pay | Admitting: Oncology

## 2013-10-21 VITALS — BP 120/73 | HR 66 | Temp 97.1°F | Resp 18 | Ht 66.0 in | Wt 151.4 lb

## 2013-10-21 DIAGNOSIS — C569 Malignant neoplasm of unspecified ovary: Secondary | ICD-10-CM

## 2013-10-21 DIAGNOSIS — Z1231 Encounter for screening mammogram for malignant neoplasm of breast: Secondary | ICD-10-CM

## 2013-10-21 DIAGNOSIS — Z803 Family history of malignant neoplasm of breast: Secondary | ICD-10-CM

## 2013-10-21 LAB — CBC WITH DIFFERENTIAL/PLATELET
BASO%: 1.7 % (ref 0.0–2.0)
Basophils Absolute: 0.1 10*3/uL (ref 0.0–0.1)
EOS ABS: 0.1 10*3/uL (ref 0.0–0.5)
EOS%: 2.5 % (ref 0.0–7.0)
HCT: 39.1 % (ref 34.8–46.6)
HGB: 13.1 g/dL (ref 11.6–15.9)
LYMPH%: 42.4 % (ref 14.0–49.7)
MCH: 30.5 pg (ref 25.1–34.0)
MCHC: 33.5 g/dL (ref 31.5–36.0)
MCV: 91.1 fL (ref 79.5–101.0)
MONO#: 0.4 10*3/uL (ref 0.1–0.9)
MONO%: 8.1 % (ref 0.0–14.0)
NEUT%: 45.3 % (ref 38.4–76.8)
NEUTROS ABS: 2.4 10*3/uL (ref 1.5–6.5)
Platelets: 278 10*3/uL (ref 145–400)
RBC: 4.29 10*6/uL (ref 3.70–5.45)
RDW: 13.6 % (ref 11.2–14.5)
WBC: 5.3 10*3/uL (ref 3.9–10.3)
lymph#: 2.2 10*3/uL (ref 0.9–3.3)

## 2013-10-21 LAB — COMPREHENSIVE METABOLIC PANEL (CC13)
ALBUMIN: 4.3 g/dL (ref 3.5–5.0)
ALK PHOS: 50 U/L (ref 40–150)
ALT: 12 U/L (ref 0–55)
AST: 15 U/L (ref 5–34)
Anion Gap: 9 mEq/L (ref 3–11)
BILIRUBIN TOTAL: 0.29 mg/dL (ref 0.20–1.20)
BUN: 15.3 mg/dL (ref 7.0–26.0)
CO2: 26 mEq/L (ref 22–29)
Calcium: 10 mg/dL (ref 8.4–10.4)
Chloride: 108 mEq/L (ref 98–109)
Creatinine: 0.8 mg/dL (ref 0.6–1.1)
GLUCOSE: 98 mg/dL (ref 70–140)
POTASSIUM: 4 meq/L (ref 3.5–5.1)
SODIUM: 143 meq/L (ref 136–145)
TOTAL PROTEIN: 6.6 g/dL (ref 6.4–8.3)

## 2013-10-21 NOTE — Telephone Encounter (Signed)
PT S/W KIM IN [facility] RE CHANGING APPT W/GEHRIG - APPT MOVED TO 7/16 - LB REMAINS 7/8. PER 4/20 POF LB PRIOR TO GYNONC APPT. PER 4/20 POF LB/LL JAN 2016. PT TO BE CONTACTED LL SCHEDULE FOR JAN NOT YET AVAIALABLE. REMINDERS TO MD/AM
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
## 2013-10-22 ENCOUNTER — Telehealth: Payer: Self-pay | Admitting: *Deleted

## 2013-10-22 LAB — CA 125: CA 125: 3.9 U/mL (ref 0.0–30.2)

## 2013-10-22 NOTE — Telephone Encounter (Signed)
Message copied by Patton Salles on Tue Oct 22, 2013  9:14 AM ------      Message from: Evlyn Clines P      Created: Mon Oct 21, 2013 10:09 PM       I do not know if she has had colonoscopy done, none in EMR that I can find.       Please ask if she has had this; we can make referral if needed (ask if she has preferences for physician group, female or female MD if so)            Cc LA, TH ------

## 2013-10-22 NOTE — Telephone Encounter (Signed)
Message copied by Sharlynn Oliphant A on Tue Oct 22, 2013  3:20 PM ------      Message from: Gordy Levan      Created: Tue Oct 22, 2013  3:10 PM       Labs seen and need follow up: please let her know this was in good low range at 3.9 ------

## 2013-10-22 NOTE — Telephone Encounter (Signed)
Left message on cell phone with results below

## 2013-10-22 NOTE — Telephone Encounter (Signed)
Pt states she had a colonoscopy in October 2014 at [facility]

## 2013-10-22 NOTE — Telephone Encounter (Signed)
Message copied by Sharlynn Oliphant A on Tue Oct 22, 2013  2:00 PM ------      Message from: Evlyn Clines P      Created: Mon Oct 21, 2013 10:09 PM       I do not know if she has had colonoscopy done, none in EMR that I can find.       Please ask if she has had this; we can make referral if needed (ask if she has preferences for physician group, female or female MD if so)            Cc LA, TH ------

## 2013-10-22 NOTE — Telephone Encounter (Signed)
No answer or voice mail

## 2014-01-02 ENCOUNTER — Ambulatory Visit: Payer: 59 | Admitting: Gynecologic Oncology

## 2014-01-08 ENCOUNTER — Other Ambulatory Visit (HOSPITAL_BASED_OUTPATIENT_CLINIC_OR_DEPARTMENT_OTHER): Payer: 59

## 2014-01-08 DIAGNOSIS — C569 Malignant neoplasm of unspecified ovary: Secondary | ICD-10-CM

## 2014-01-08 LAB — CBC WITH DIFFERENTIAL/PLATELET
BASO%: 2 % (ref 0.0–2.0)
BASOS ABS: 0.1 10*3/uL (ref 0.0–0.1)
EOS%: 3.2 % (ref 0.0–7.0)
Eosinophils Absolute: 0.2 10*3/uL (ref 0.0–0.5)
HEMATOCRIT: 39.5 % (ref 34.8–46.6)
HEMOGLOBIN: 12.9 g/dL (ref 11.6–15.9)
LYMPH#: 2.4 10*3/uL (ref 0.9–3.3)
LYMPH%: 42.5 % (ref 14.0–49.7)
MCH: 30.7 pg (ref 25.1–34.0)
MCHC: 32.7 g/dL (ref 31.5–36.0)
MCV: 93.6 fL (ref 79.5–101.0)
MONO#: 0.4 10*3/uL (ref 0.1–0.9)
MONO%: 7.8 % (ref 0.0–14.0)
NEUT#: 2.5 10*3/uL (ref 1.5–6.5)
NEUT%: 44.5 % (ref 38.4–76.8)
PLATELETS: 265 10*3/uL (ref 145–400)
RBC: 4.22 10*6/uL (ref 3.70–5.45)
RDW: 13.4 % (ref 11.2–14.5)
WBC: 5.5 10*3/uL (ref 3.9–10.3)

## 2014-01-08 LAB — COMPREHENSIVE METABOLIC PANEL (CC13)
ALT: 23 U/L (ref 0–55)
AST: 14 U/L (ref 5–34)
Albumin: 4.1 g/dL (ref 3.5–5.0)
Alkaline Phosphatase: 53 U/L (ref 40–150)
Anion Gap: 7 mEq/L (ref 3–11)
BILIRUBIN TOTAL: 0.31 mg/dL (ref 0.20–1.20)
BUN: 19.9 mg/dL (ref 7.0–26.0)
CALCIUM: 9.5 mg/dL (ref 8.4–10.4)
CHLORIDE: 108 meq/L (ref 98–109)
CO2: 28 mEq/L (ref 22–29)
CREATININE: 0.8 mg/dL (ref 0.6–1.1)
Glucose: 102 mg/dl (ref 70–140)
Potassium: 3.9 mEq/L (ref 3.5–5.1)
Sodium: 142 mEq/L (ref 136–145)
Total Protein: 6.4 g/dL (ref 6.4–8.3)

## 2014-01-09 LAB — CA 125: CA 125: 4 U/mL (ref 0.0–30.2)

## 2014-01-16 ENCOUNTER — Ambulatory Visit: Payer: 59 | Admitting: Gynecologic Oncology

## 2014-02-26 ENCOUNTER — Encounter: Payer: Self-pay | Admitting: Gynecologic Oncology

## 2014-02-26 ENCOUNTER — Ambulatory Visit: Payer: 59 | Attending: Gynecologic Oncology | Admitting: Gynecologic Oncology

## 2014-02-26 VITALS — BP 115/76 | HR 65 | Temp 98.4°F | Resp 18 | Ht 66.0 in | Wt 156.6 lb

## 2014-02-26 DIAGNOSIS — C569 Malignant neoplasm of unspecified ovary: Secondary | ICD-10-CM

## 2014-02-26 DIAGNOSIS — Z9221 Personal history of antineoplastic chemotherapy: Secondary | ICD-10-CM | POA: Insufficient documentation

## 2014-02-26 DIAGNOSIS — Z8543 Personal history of malignant neoplasm of ovary: Secondary | ICD-10-CM | POA: Diagnosis not present

## 2014-02-26 DIAGNOSIS — Z886 Allergy status to analgesic agent status: Secondary | ICD-10-CM | POA: Diagnosis not present

## 2014-02-26 DIAGNOSIS — Z90710 Acquired absence of both cervix and uterus: Secondary | ICD-10-CM | POA: Diagnosis not present

## 2014-02-26 NOTE — Progress Notes (Signed)
Consult Note: Gyn-Onc  Jordan Golden 53 y.o. female  CC:  Chief Complaint  Patient presents with  . Ovarian Cancer    HPI: Patient is seen, alone for visit, in follow up of her adjuvant taxol/ carboplatin chemotherapy for IA high grade ovarian carcinoma. She had third and last planned cycle of taxol/carboplatin on 12-09-11, with 3 days of neupogen after that treatment.  History is of pelvic mass, which patient initially appreciated herself in Feb 2013, then evaluation by Dr.Romine which included [place], CT and CA 125 of 15. Ultrasound at that time revealed a 10.2 x 3.1 x 4.7 cm uterus. There was a complex cystic mass measuring approximately 10 cm. She had a CT scan of the abdomen and pelvis that revealed in mid pelvic mass measuring 16 x 9.7 x 12.0 cm.   Surgery was by [facility] [address] [address] 09-20-2011 withTAH/BSO/omentectomy/bilateral pelvic and periaortic lymphadenectomy for FIGO 1A high grade serous carcinoma of ovary, 21 cm with intact capsule and arising in background of borderline serous tumor. Operative findings included a mobile left ovarian mass measuring 16 cm that was non-adherent. Frozen section was consistent with a low malignant potential ovarian tumor within nodule of adenocarcinoma.  Final pathology revealed:  Diagnosis  1. Adnexa - ovary +/- tube, neoplastic, left ovary and fallopian  - INVASIVE HIGH GRADE SEROUS CARCINOMA ARISING IN A BACKGROUND OF BORDERLINE SEROUS TUMOR, 21 CM, WITH INTACT CAPSULE. PLEASE SEE ONCOLOGY  TEMPLATE FOR DETAIL.  2. Uterus and cervix, with right tube and ovary  - LEIOMYOMATA.  - ENDOMETRIUM: BENIGN WEAKLY PROLIFERATIVE ENDOMETRIUM, NO ATYPIA, HYPERPLASIA OR MALIGNANCY.  - CERVIX: BENIGN SQUAMOUS MUCOSA AND ENDOCERVICAL MUCOSA, NO DYSPLASIA OR MALIGNANCY.  - RIGHT OVARY: BENIGN OVARIAN TISSUE WITH BENIGN SEROUS CYSTS, NO EVIDENCE OF ENDOMETRIOSIS, ATYPIA OR MALIGNANCY.  - RIGHT FALLOPIAN TUBE: NO PATHOLOGIC ABNORMALITIES.  3. Cul-de-sac biopsy, anterior  -  BENIGN FIBROADIPOSE TISSUE, NO EVIDENCE OF MALIGNANCY.  4. Cul-de-sac biopsy, posterior  - BENIGN FIBROADIPOSE TISSUE, NO EVIDENCE OF MALIGNANCY.  5. Soft tissue, biopsy, right pelvic side wall  - BENIGN FIBROADIPOSE TISSUE, NO EVIDENCE OF MALIGNANCY.  6. Lymph nodes, regional resection, right pelvic  - SEVEN LYMPH NODES, NEGATIVE FOR METASTATIC CARCINOMA (0/7).  7. Lymph node, biopsy, left pelvic  - ONE LYMPH NODE, NEGATIVE FOR METASTATIC CARCINOMA (0/1).  8. Lymph node, biopsy, para aortic  - FIVE LYMPH NODES, NEGATIVE FOR METASTATIC CARCINOMA (0/5).  9. Soft tissue, biopsy, left para colic gutter  - BENIGN FIBROADIPOSE TISSUE, NO EVIDENCE OF MALIGNANCY.  10. Soft tissue, biopsy, right para colic gutter  - BENIGN FIBROADIPOSE TISSUE, NO EVIDENCE OF MALIGNANCY.  11. Omentum, resection for tumor  -MATURE ADIPOSE TISSUE, NO EVIDENCE OF MALIGNANCY.  - FOUR LYMPH NODES, NEGATIVE FOR METASTATIC CARCINOMA (0/4)  12. Diaphragm, biopsy  - BENIGN FIBROADIPOSE TISSUE WITH ASSOCIATED MILD CHRONIC INFLAMMATION, NO EVIDENCE OF MALIGNANCY.  13. Soft tissue, biopsy, left pelvic side wall  - BENIGN FIBROADIPOSE TISSUE, NO EVIDENCE OF MALIGNANCY.    She had second opinion consultation with Dr.Roland Barrett prior to deciding to proceed with chemotherapy. She completed 3 cycles of paclitaxel and carboplatin from 10/19/2011 through 12/09/2011. She is negative for BRCA testing.  Interval History:  I last saw her in Jan 2015. She was seen by Dr. Marko Plume in April 2015. Her CA 125 at that time was 3.9.  Her most recent CA 125 in July was 4. She's overall doing very well. There are no new medical problems in the family. She had a colonoscopy in October  of last year (2014) does not require another one for 10 years. She is due for a mammogram in a few months and got a reminder in the mail regarding this. She is busy taking care of her 3 children. The age is her 5 months, 6 months, and 19 months. As a result, she  is physically active.  Review of Systems:  Constitutional: Denies fever. Cardiovascular: No chest pain, shortness of breath, or edema  Pulmonary: No cough Gastro Intestinal: No nausea, vomiting, constipation, or diarrhea reported. No bright red blood per rectum or change in bowel movement.  Genitourinary: No frequency, urgency, or dysuria.  Denies vaginal bleeding and discharge. + SA, no pain, no dyspareunia.  Musculoskeletal: No myalgia, arthralgia, joint swelling or pain.  Neurologic: No weakness, numbness, or change in gait.  Psychology: Feeling "really great"    Current Meds:  Outpatient Encounter Prescriptions as of 02/26/2014  Medication Sig  . Calcium Carbonate-Vitamin D (CALCIUM + D PO) Take 1 tablet by mouth daily.  . Cholecalciferol (VITAMIN D-3) 1000 UNITS CAPS Take 1 capsule by mouth 2 (two) times daily.  . Multiple Vitamins-Minerals (MULTIVITAMIN WITH MINERALS) tablet Take 1 tablet by mouth every morning.   Marland Kitchen OVER THE COUNTER MEDICATION Essential Oils Cellular Vitality Complex 1 tab daily    Allergy:  Allergies  Allergen Reactions  . Ultram [Tramadol Hcl] Nausea And Vomiting    Passed out    Social Hx:   History   Social History  . Marital Status: Married    Spouse Name: N/A    Number of Children: N/A  . Years of Education: N/A   Occupational History  . Not on file.   Social History Main Topics  . Smoking status: Never Smoker   . Smokeless tobacco: Never Used  . Alcohol Use: No  . Drug Use: No  . Sexual Activity: Not on file   Other Topics Concern  . Not on file   Social History Narrative  . No narrative on file    Past Surgical Hx:  Past Surgical History  Procedure Laterality Date  . Brain surgery  1996    Due to brain injury  . Cesarean section  1995  . Appendectomy    . Laparotomy  09/20/2011    Procedure: EXPLORATORY LAPAROTOMY;  Surgeon: Janie Morning, MD PHD;  Location: [facility];  Service: Gynecology;  Laterality: N/A;  . Abdominal  hysterectomy  09/20/2011    Procedure: HYSTERECTOMY ABDOMINAL;  Surgeon: Janie Morning, MD PHD;  Location: [facility];  Service: Gynecology;  Laterality: N/A;  Total  . Salpingoophorectomy  09/20/2011    Procedure: SALPINGO OOPHERECTOMY;  Surgeon: Janie Morning, MD PHD;  Location: [facility];  Service: Gynecology;  Laterality: Bilateral;    Past Medical Hx:  Past Medical History  Diagnosis Date  . Ovarian mass   . Osteoporosis   . Kidney stones   . OVARIAN CA dx' d7/2013    Oncology Hx:    Ovary cancer   09/27/2011 Initial Diagnosis Ovary cancer   10/19/2011 - 12/09/2011 Chemotherapy 3 cycles paclitaxel and carboplatin    Family Hx:  Family History  Problem Relation Age of Onset  . Breast cancer Mother 26  . Heart disease Father   . Diabetes Sister   . Breast cancer Maternal Aunt     diagnosed over 66; had 5 breast primaries  . Breast cancer Maternal Aunt     over age 43; maternal half sister    Vitals:  Blood pressure 115/76, pulse  65, temperature 98.4 F (36.9 C), temperature source Oral, resp. rate 18, height 5' 6"  (1.676 m), weight 156 lb 9.6 oz (71.033 kg).  Physical Exam:  Well-nourished well-developed female in no acute distress.   Neck: Supple, no lymphadenopathy no thyromegaly.   Lungs: Clear to auscultation bilaterally.   Cardiovascular: Regular rate and rhythm.   Abdomen: Shows well-healed vertical midline incision. Abdomen is soft, nontender, nondistended. There is no evidence of an incisional hernia. There is no fluid wave. There is no hepato-splenomegaly. There are no masses. There may be a small diastasis at the level of the umbilicus but no distinct hernia.  Groins: No lymphadenopathy.   Extremities: No edema.   Pelvic: External genitalia within normal limits. There is no nodularity. Rectal confirms the above.   Assessment/Plan:  53 year old with a stage IA grade 3 serious high grade ovarian carcinoma has completed 3 cycles of paclitaxel and carboplatin  with her last cycle of chemotherapy been on December 09, 2011. We'll followup in results of her CA 125 from today.  Plan:She has follow up scheduled with Dr. Marko Plume in 3 months and will return to see [place] in 6 months. We will continue this followup to next year which would be her 3 year anniversary. At that time we'll start her appointments out to every 6 months alternating between Dr. Marko Plume and GYN oncology for an additional 2 years. She is up-to-date on her mammograms and colonoscopy.     Obi Scrima A., MD 02/26/2014, 10:34 AM

## 2014-02-26 NOTE — Patient Instructions (Addendum)
Followup Dr. Marko Plume in 3 months and return to see [place] in gyn oncology in 6 months.  Please schedule your appt with Dr. Alycia Rossetti after you see Dr. Marko Plume.

## 2014-04-19 ENCOUNTER — Telehealth: Payer: Self-pay | Admitting: Oncology

## 2014-04-19 NOTE — Telephone Encounter (Signed)
, °

## 2014-05-26 ENCOUNTER — Ambulatory Visit: Payer: 59 | Admitting: Oncology

## 2014-07-06 ENCOUNTER — Other Ambulatory Visit: Payer: Self-pay | Admitting: Oncology

## 2014-07-06 DIAGNOSIS — C569 Malignant neoplasm of unspecified ovary: Secondary | ICD-10-CM

## 2014-07-14 ENCOUNTER — Encounter: Payer: Self-pay | Admitting: Oncology

## 2014-07-14 ENCOUNTER — Ambulatory Visit (HOSPITAL_BASED_OUTPATIENT_CLINIC_OR_DEPARTMENT_OTHER): Payer: 59

## 2014-07-14 ENCOUNTER — Telehealth: Payer: Self-pay | Admitting: Oncology

## 2014-07-14 ENCOUNTER — Ambulatory Visit (HOSPITAL_BASED_OUTPATIENT_CLINIC_OR_DEPARTMENT_OTHER): Payer: 59 | Admitting: Oncology

## 2014-07-14 VITALS — BP 121/72 | HR 73 | Temp 98.0°F | Resp 18 | Ht 66.0 in | Wt 166.0 lb

## 2014-07-14 DIAGNOSIS — C569 Malignant neoplasm of unspecified ovary: Secondary | ICD-10-CM

## 2014-07-14 DIAGNOSIS — R922 Inconclusive mammogram: Secondary | ICD-10-CM

## 2014-07-14 DIAGNOSIS — Z1231 Encounter for screening mammogram for malignant neoplasm of breast: Secondary | ICD-10-CM

## 2014-07-14 LAB — COMPREHENSIVE METABOLIC PANEL (CC13)
ALT: 18 U/L (ref 0–55)
AST: 14 U/L (ref 5–34)
Albumin: 4.2 g/dL (ref 3.5–5.0)
Alkaline Phosphatase: 61 U/L (ref 40–150)
Anion Gap: 4 mEq/L (ref 3–11)
BUN: 22.1 mg/dL (ref 7.0–26.0)
CHLORIDE: 107 meq/L (ref 98–109)
CO2: 31 mEq/L — ABNORMAL HIGH (ref 22–29)
CREATININE: 0.8 mg/dL (ref 0.6–1.1)
Calcium: 9.3 mg/dL (ref 8.4–10.4)
EGFR: 81 mL/min/{1.73_m2} — AB (ref >90)
GLUCOSE: 75 mg/dL (ref 70–140)
POTASSIUM: 3.9 meq/L (ref 3.5–5.1)
Sodium: 142 mEq/L (ref 136–145)
TOTAL PROTEIN: 6.5 g/dL (ref 6.4–8.3)
Total Bilirubin: 0.36 mg/dL (ref 0.20–1.20)

## 2014-07-14 LAB — CBC WITH DIFFERENTIAL/PLATELET
BASO%: 1.4 % (ref 0.0–2.0)
Basophils Absolute: 0.1 10*3/uL (ref 0.0–0.1)
EOS%: 2.5 % (ref 0.0–7.0)
Eosinophils Absolute: 0.1 10*3/uL (ref 0.0–0.5)
HCT: 41.6 % (ref 34.8–46.6)
HGB: 13.4 g/dL (ref 11.6–15.9)
LYMPH%: 35.9 % (ref 14.0–49.7)
MCH: 30.1 pg (ref 25.1–34.0)
MCHC: 32.2 g/dL (ref 31.5–36.0)
MCV: 93.6 fL (ref 79.5–101.0)
MONO#: 0.6 10*3/uL (ref 0.1–0.9)
MONO%: 9.7 % (ref 0.0–14.0)
NEUT#: 2.9 10*3/uL (ref 1.5–6.5)
NEUT%: 50.5 % (ref 38.4–76.8)
PLATELETS: 290 10*3/uL (ref 145–400)
RBC: 4.44 10*6/uL (ref 3.70–5.45)
RDW: 13.3 % (ref 11.2–14.5)
WBC: 5.7 10*3/uL (ref 3.9–10.3)
lymph#: 2 10*3/uL (ref 0.9–3.3)
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
## 2014-07-15 LAB — CA 125: CA 125: 6 U/mL (ref <35)

## 2014-07-15 LAB — CA 125(PREVIOUS METHOD): CA 125: 5.1 U/mL (ref 0.0–30.2)

## 2014-07-16 ENCOUNTER — Telehealth: Payer: Self-pay

## 2014-07-16 ENCOUNTER — Encounter: Payer: Self-pay | Admitting: Genetic Counselor

## 2014-07-16 DIAGNOSIS — Z1379 Encounter for other screening for genetic and chromosomal anomalies: Secondary | ICD-10-CM | POA: Insufficient documentation

## 2014-07-16 NOTE — Telephone Encounter (Signed)
Left a messaged in Ms. Bansal's voice mail stating the labs results as noted below by Dr. Marko Plume.  Ms. Harwood can call back to Dr. Mariana Kaufman office if any questions or concerns.

## 2014-07-16 NOTE — Telephone Encounter (Signed)
-----   Message from Gordy Levan, MD sent at 07/15/2014  3:33 PM EST ----- Labs seen and need follow up please let her know all labs good, including CA 125 in good low range at 6

## 2014-08-11 ENCOUNTER — Other Ambulatory Visit: Payer: Self-pay | Admitting: Family Medicine

## 2014-08-11 DIAGNOSIS — M810 Age-related osteoporosis without current pathological fracture: Secondary | ICD-10-CM

## 2014-08-19 ENCOUNTER — Ambulatory Visit
Admission: RE | Admit: 2014-08-19 | Discharge: 2014-08-19 | Disposition: A | Discharge status: Home / Self Care | Payer: 59 | Source: Ambulatory Visit | Attending: Family Medicine | Admitting: Family Medicine

## 2014-08-19 ENCOUNTER — Ambulatory Visit
Admission: RE | Admit: 2014-08-19 | Discharge: 2014-08-19 | Disposition: A | Discharge status: Home / Self Care | Payer: 59 | Source: Ambulatory Visit | Attending: Oncology | Admitting: Oncology

## 2014-08-19 DIAGNOSIS — M810 Age-related osteoporosis without current pathological fracture: Secondary | ICD-10-CM

## 2014-08-19 DIAGNOSIS — Z1231 Encounter for screening mammogram for malignant neoplasm of breast: Secondary | ICD-10-CM

## 2014-08-19 DIAGNOSIS — R922 Inconclusive mammogram: Secondary | ICD-10-CM

## 2014-08-20 ENCOUNTER — Telehealth: Payer: Self-pay

## 2014-08-20 ENCOUNTER — Other Ambulatory Visit: Payer: Self-pay | Admitting: Oncology

## 2014-08-20 NOTE — Telephone Encounter (Signed)
Discussed results of the bone density and mammogram  as noted below by Dr. Marko Plume.  Ms. Megna stated that Dr. Harrington Challenger did order the bone density.  She will call his office for instructions as noted by Dr. Marko Plume.  Ms. Finan stated that she had a copy of the results sent to Dr. Marko Plume.

## 2014-08-20 NOTE — Telephone Encounter (Signed)
-----   Message from Gordy Levan, MD sent at 08/20/2014  1:51 PM EST ----- Labs seen and need follow up: please let her know bone density is quite low on this scan, in osteoporotic range. The bone density may have been ordered by PCP Dr Melinda Crutch - but if she does not hear from him with instructions, she needs to call that office or to let us know. Please let her know the 3D tomo mammograms done same day as bone density scan still had dense breast tissue (so tomo was a good choice for imaging), but nothing otherwise of concern.

## 2015-05-20 ENCOUNTER — Telehealth: Payer: Self-pay | Admitting: Oncology

## 2015-05-20 NOTE — Telephone Encounter (Signed)
Called and left a message with 07/2015 appointments °

## 2015-07-03 ENCOUNTER — Telehealth: Payer: Self-pay | Admitting: Oncology

## 2015-07-03 NOTE — Telephone Encounter (Signed)
Spoke with patient and rescheduled her appointment as dr Marko Plume is out 07/16/15

## 2015-07-16 ENCOUNTER — Other Ambulatory Visit: Payer: 59

## 2015-07-16 ENCOUNTER — Ambulatory Visit: Payer: 59 | Admitting: Oncology

## 2015-08-07 ENCOUNTER — Other Ambulatory Visit: Payer: Self-pay

## 2015-08-07 DIAGNOSIS — C569 Malignant neoplasm of unspecified ovary: Secondary | ICD-10-CM

## 2015-08-09 ENCOUNTER — Other Ambulatory Visit: Payer: Self-pay | Admitting: Oncology

## 2015-08-10 ENCOUNTER — Other Ambulatory Visit: Payer: Self-pay | Admitting: Oncology

## 2015-08-10 ENCOUNTER — Telehealth: Payer: Self-pay | Admitting: Oncology

## 2015-08-10 ENCOUNTER — Other Ambulatory Visit (HOSPITAL_BASED_OUTPATIENT_CLINIC_OR_DEPARTMENT_OTHER): Payer: BLUE CROSS/BLUE SHIELD

## 2015-08-10 ENCOUNTER — Ambulatory Visit (HOSPITAL_BASED_OUTPATIENT_CLINIC_OR_DEPARTMENT_OTHER): Payer: BLUE CROSS/BLUE SHIELD | Admitting: Oncology

## 2015-08-10 ENCOUNTER — Encounter: Payer: Self-pay | Admitting: Oncology

## 2015-08-10 VITALS — BP 121/68 | HR 68 | Temp 98.1°F | Resp 18 | Ht 66.0 in | Wt 168.0 lb

## 2015-08-10 DIAGNOSIS — Z1231 Encounter for screening mammogram for malignant neoplasm of breast: Secondary | ICD-10-CM

## 2015-08-10 DIAGNOSIS — Z1239 Encounter for other screening for malignant neoplasm of breast: Secondary | ICD-10-CM

## 2015-08-10 DIAGNOSIS — C569 Malignant neoplasm of unspecified ovary: Secondary | ICD-10-CM | POA: Diagnosis not present

## 2015-08-10 DIAGNOSIS — Z8543 Personal history of malignant neoplasm of ovary: Secondary | ICD-10-CM | POA: Diagnosis not present

## 2015-08-10 DIAGNOSIS — R922 Inconclusive mammogram: Secondary | ICD-10-CM

## 2015-08-10 LAB — CBC WITH DIFFERENTIAL/PLATELET
BASO%: 1.9 % (ref 0.0–2.0)
Basophils Absolute: 0.1 10*3/uL (ref 0.0–0.1)
EOS ABS: 0.2 10*3/uL (ref 0.0–0.5)
EOS%: 3.1 % (ref 0.0–7.0)
HCT: 41.7 % (ref 34.8–46.6)
HEMOGLOBIN: 13.6 g/dL (ref 11.6–15.9)
LYMPH%: 39.5 % (ref 14.0–49.7)
MCH: 30.1 pg (ref 25.1–34.0)
MCHC: 32.7 g/dL (ref 31.5–36.0)
MCV: 92 fL (ref 79.5–101.0)
MONO#: 0.4 10*3/uL (ref 0.1–0.9)
MONO%: 7.9 % (ref 0.0–14.0)
NEUT#: 2.5 10*3/uL (ref 1.5–6.5)
NEUT%: 47.6 % (ref 38.4–76.8)
PLATELETS: 268 10*3/uL (ref 145–400)
RBC: 4.53 10*6/uL (ref 3.70–5.45)
RDW: 13.4 % (ref 11.2–14.5)
WBC: 5.3 10*3/uL (ref 3.9–10.3)
lymph#: 2.1 10*3/uL (ref 0.9–3.3)

## 2015-08-10 LAB — COMPREHENSIVE METABOLIC PANEL WITH GFR
ALT: 14 U/L (ref 0–55)
AST: 15 U/L (ref 5–34)
Albumin: 4.2 g/dL (ref 3.5–5.0)
Alkaline Phosphatase: 52 U/L (ref 40–150)
Anion Gap: 9 meq/L (ref 3–11)
BUN: 22.9 mg/dL (ref 7.0–26.0)
CO2: 27 meq/L (ref 22–29)
Calcium: 9.6 mg/dL (ref 8.4–10.4)
Chloride: 108 meq/L (ref 98–109)
Creatinine: 1.1 mg/dL (ref 0.6–1.1)
EGFR: 60 ml/min/1.73 m2 — ABNORMAL LOW
Glucose: 86 mg/dL (ref 70–140)
Potassium: 4 meq/L (ref 3.5–5.1)
Sodium: 143 meq/L (ref 136–145)
Total Bilirubin: 0.39 mg/dL (ref 0.20–1.20)
Total Protein: 6.7 g/dL (ref 6.4–8.3)
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
## 2015-08-10 NOTE — Telephone Encounter (Signed)
Made follow up appopintment and printed avs. Mammogram scheduled [facility] at [facility]

## 2015-08-11 LAB — CA 125: Cancer Antigen (CA) 125: 6.8 U/mL (ref 0.0–38.1)

## 2015-08-11 LAB — CANCER ANTIGEN 125 (PARALLEL TESTING): CA 125: 6 U/mL (ref <35)

## 2015-08-12 DIAGNOSIS — R922 Inconclusive mammogram: Secondary | ICD-10-CM | POA: Insufficient documentation

## 2015-08-12 DIAGNOSIS — Z1239 Encounter for other screening for malignant neoplasm of breast: Secondary | ICD-10-CM | POA: Insufficient documentation

## 2015-08-13 ENCOUNTER — Telehealth: Payer: Self-pay

## 2015-08-13 NOTE — Telephone Encounter (Signed)
-----   Message from Gordy Levan, MD sent at 08/13/2015  7:54 AM EST ----- Labs seen and need follow up; in next few days when possible, please let patient know that all labs including CA 125 marker in good range

## 2015-08-13 NOTE — Telephone Encounter (Signed)
S/w pt per Dr Edwyna Shell message

## 2015-08-28 ENCOUNTER — Inpatient Hospital Stay: Admission: RE | Admit: 2015-08-28 | Payer: BLUE CROSS/BLUE SHIELD | Source: Ambulatory Visit

## 2015-10-05 ENCOUNTER — Ambulatory Visit: Payer: BLUE CROSS/BLUE SHIELD | Attending: Gynecologic Oncology | Admitting: Gynecologic Oncology

## 2015-10-05 ENCOUNTER — Encounter: Payer: Self-pay | Admitting: Gynecologic Oncology

## 2015-10-05 VITALS — BP 121/73 | HR 72 | Temp 98.1°F | Resp 20 | Ht 66.0 in | Wt 165.6 lb

## 2015-10-05 DIAGNOSIS — Z8543 Personal history of malignant neoplasm of ovary: Secondary | ICD-10-CM | POA: Diagnosis not present

## 2015-10-05 DIAGNOSIS — M810 Age-related osteoporosis without current pathological fracture: Secondary | ICD-10-CM | POA: Diagnosis not present

## 2015-10-05 DIAGNOSIS — Z9221 Personal history of antineoplastic chemotherapy: Secondary | ICD-10-CM | POA: Insufficient documentation

## 2015-10-05 DIAGNOSIS — Z08 Encounter for follow-up examination after completed treatment for malignant neoplasm: Secondary | ICD-10-CM | POA: Diagnosis not present

## 2015-10-05 DIAGNOSIS — C569 Malignant neoplasm of unspecified ovary: Secondary | ICD-10-CM

## 2015-10-05 NOTE — Progress Notes (Signed)
Consult Note: Gyn-Onc  Jordan Golden 55 y.o. female  CC:  Chief Complaint  Patient presents with  . Ovarian Cancer    Follow up MD visit    HPI: Patient is seen, alone for visit, in follow up of her adjuvant taxol/ carboplatin chemotherapy for IA high grade ovarian carcinoma. She had third and last planned cycle of taxol/carboplatin on 12-09-11, with 3 days of neupogen after that treatment.  History is of pelvic mass, which patient initially appreciated herself in Feb 2013, then evaluation by Dr.Romine which included [place], CT and CA 125 of 15. Ultrasound at that time revealed a 10.2 x 3.1 x 4.7 cm uterus. There was a complex cystic mass measuring approximately 10 cm. She had a CT scan of the abdomen and pelvis that revealed in mid pelvic mass measuring 16 x 9.7 x 12.0 cm.   Surgery was by [facility] 09-20-2011 withTAH/BSO/omentectomy/bilateral pelvic and periaortic lymphadenectomy for FIGO 1A high grade serous carcinoma of ovary, 21 cm with intact capsule and arising in background of borderline serous tumor. Operative findings included a mobile left ovarian mass measuring 16 cm that was non-adherent. Frozen section was consistent with a low malignant potential ovarian tumor within nodule of adenocarcinoma.  Final pathology revealed:  Diagnosis  1. Adnexa - ovary +/- tube, neoplastic, left ovary and fallopian  - INVASIVE HIGH GRADE SEROUS CARCINOMA ARISING IN A BACKGROUND OF BORDERLINE SEROUS TUMOR, 21 CM, WITH INTACT CAPSULE. PLEASE SEE ONCOLOGY  TEMPLATE FOR DETAIL.  2. Uterus and cervix, with right tube and ovary  - LEIOMYOMATA.  - ENDOMETRIUM: BENIGN WEAKLY PROLIFERATIVE ENDOMETRIUM, NO ATYPIA, HYPERPLASIA OR MALIGNANCY.  - CERVIX: BENIGN SQUAMOUS MUCOSA AND ENDOCERVICAL MUCOSA, NO DYSPLASIA OR MALIGNANCY.  - RIGHT OVARY: BENIGN OVARIAN TISSUE WITH BENIGN SEROUS CYSTS, NO EVIDENCE OF ENDOMETRIOSIS, ATYPIA OR MALIGNANCY.  - RIGHT FALLOPIAN TUBE: NO PATHOLOGIC ABNORMALITIES.  3. Cul-de-sac  biopsy, anterior  - BENIGN FIBROADIPOSE TISSUE, NO EVIDENCE OF MALIGNANCY.  4. Cul-de-sac biopsy, posterior  - BENIGN FIBROADIPOSE TISSUE, NO EVIDENCE OF MALIGNANCY.  5. Soft tissue, biopsy, right pelvic side wall  - BENIGN FIBROADIPOSE TISSUE, NO EVIDENCE OF MALIGNANCY.  6. Lymph nodes, regional resection, right pelvic  - SEVEN LYMPH NODES, NEGATIVE FOR METASTATIC CARCINOMA (0/7).  7. Lymph node, biopsy, left pelvic  - ONE LYMPH NODE, NEGATIVE FOR METASTATIC CARCINOMA (0/1).  8. Lymph node, biopsy, para aortic  - FIVE LYMPH NODES, NEGATIVE FOR METASTATIC CARCINOMA (0/5).  9. Soft tissue, biopsy, left para colic gutter  - BENIGN FIBROADIPOSE TISSUE, NO EVIDENCE OF MALIGNANCY.  10. Soft tissue, biopsy, right para colic gutter  - BENIGN FIBROADIPOSE TISSUE, NO EVIDENCE OF MALIGNANCY.  11. Omentum, resection for tumor  -MATURE ADIPOSE TISSUE, NO EVIDENCE OF MALIGNANCY.  - FOUR LYMPH NODES, NEGATIVE FOR METASTATIC CARCINOMA (0/4)  12. Diaphragm, biopsy  - BENIGN FIBROADIPOSE TISSUE WITH ASSOCIATED MILD CHRONIC INFLAMMATION, NO EVIDENCE OF MALIGNANCY.  13. Soft tissue, biopsy, left pelvic side wall  - BENIGN FIBROADIPOSE TISSUE, NO EVIDENCE OF MALIGNANCY.    She had second opinion consultation with Dr.Roland Barrett prior to deciding to proceed with chemotherapy. She completed 3 cycles of paclitaxel and carboplatin from 10/19/2011 through 12/09/2011. She is negative for BRCA testing.  Interval History:  I last saw her in Jan 2015. She was seen by Dr. Darrold Span in February 2016. Her CA 125 at that time was stable and normal at 6. She's overall doing very well. There are no new medical problems in the family. She had a colonoscopy in  October of 2014 does not require another one for 7 years.   Review of Systems:  Constitutional: Denies fever. Cardiovascular: No chest pain, shortness of breath, or edema  Pulmonary: No cough Gastro Intestinal: No nausea, vomiting, constipation, or diarrhea  reported. No bright red blood per rectum or change in bowel movement.  Genitourinary: No frequency, urgency, or dysuria.  Denies vaginal bleeding and discharge. + SA, no pain, no dyspareunia.  Musculoskeletal: No myalgia, arthralgia, joint swelling or pain.  Neurologic: No weakness, numbness, or change in gait.  Psychology: Feeling "really great"    Current Meds:  Outpatient Encounter Prescriptions as of 10/05/2015  Medication Sig  . Calcium Carbonate-Vitamin D (CALCIUM + D PO) Take 1 tablet by mouth daily.  . Cholecalciferol (VITAMIN D-3) 1000 UNITS CAPS Take 1 capsule by mouth 2 (two) times daily.  . Multiple Vitamins-Minerals (MULTIVITAMIN WITH MINERALS) tablet Take 1 tablet by mouth every morning.   Marland Kitchen OVER THE COUNTER MEDICATION Essential Oils Cellular Vitality Complex 1 tab daily   No facility-administered encounter medications on file as of 10/05/2015.    Allergy:  Allergies  Allergen Reactions  . Ultram [Tramadol Hcl] Nausea And Vomiting    Passed out    Social Hx:   Social History   Social History  . Marital Status: Married    Spouse Name: N/A  . Number of Children: N/A  . Years of Education: N/A   Occupational History  . Not on file.   Social History Main Topics  . Smoking status: Never Smoker   . Smokeless tobacco: Never Used  . Alcohol Use: No  . Drug Use: No  . Sexual Activity: Not on file   Other Topics Concern  . Not on file   Social History Narrative    Past Surgical Hx:  Past Surgical History  Procedure Laterality Date  . Brain surgery  1996    Due to brain injury  . Cesarean section  1995  . Appendectomy    . Laparotomy  09/20/2011    Procedure: EXPLORATORY LAPAROTOMY;  Surgeon: Janie Morning, MD PHD;  Location: [facility];  Service: Gynecology;  Laterality: N/A;  . Abdominal hysterectomy  09/20/2011    Procedure: HYSTERECTOMY ABDOMINAL;  Surgeon: Janie Morning, MD PHD;  Location: [facility];  Service: Gynecology;  Laterality: N/A;  Total  .  Salpingoophorectomy  09/20/2011    Procedure: SALPINGO OOPHERECTOMY;  Surgeon: Janie Morning, MD PHD;  Location: [facility];  Service: Gynecology;  Laterality: Bilateral;    Past Medical Hx:  Past Medical History  Diagnosis Date  . Ovarian mass   . Osteoporosis   . Kidney stones   . OVARIAN CA dx' d7/2013    Oncology Hx:    Ovary cancer ([place])   09/27/2011 Initial Diagnosis Ovary cancer   10/19/2011 - 12/09/2011 Chemotherapy 3 cycles paclitaxel and carboplatin    Family Hx:  Family History  Problem Relation Age of Onset  . Breast cancer Mother 77  . Heart disease Father   . Diabetes Sister   . Breast cancer Maternal Aunt     diagnosed over 46; had 5 breast primaries  . Breast cancer Maternal Aunt     over age 42; maternal half sister    Vitals:  Blood pressure 121/73, pulse 72, temperature 98.1 F (36.7 C), temperature source Oral, resp. rate 20, height _0  (1.676 m), weight 165 lb 9.6 oz (75.116 kg), SpO2 100 %.  Physical Exam:  Well-nourished well-developed female in no acute distress.  Neck: Supple, no lymphadenopathy no thyromegaly.   Lungs: Clear to auscultation bilaterally.   Cardiovascular: Regular rate and rhythm.   Abdomen: Shows well-healed vertical midline incision. Abdomen is soft, nontender, nondistended. There is no evidence of an incisional hernia. There is no fluid wave. There is no hepato-splenomegaly. There are no masses. There may be a small diastasis at the level of the umbilicus but no distinct hernia.  Groins: No lymphadenopathy.   Extremities: No edema.   Pelvic: External genitalia within normal limits. There is no nodularity. Rectal confirms the above.   Assessment/Plan:  55 year old with a stage IA grade 3 serious high grade ovarian carcinoma has completed 3 cycles of paclitaxel and carboplatin with her last cycle of chemotherapy been on December 09, 2011. No evidence of disease on today's exam.  Plan: We will see her for follow-up in 6 months  (October, 2017) and then with Dr Marko Plume in  April 2018, after which time she can be released from ovarian cancer surveillance.   Donaciano Eva, MD 10/05/2015, 2:38 PM

## 2015-10-05 NOTE — Patient Instructions (Signed)
Plan to follow up in six months or sooner if needed.  Please call in one to two weeks to schedule.

## 2015-11-18 ENCOUNTER — Ambulatory Visit
Admission: RE | Admit: 2015-11-18 | Discharge: 2015-11-18 | Disposition: A | Discharge status: Home / Self Care | Payer: BLUE CROSS/BLUE SHIELD | Source: Ambulatory Visit | Attending: Oncology | Admitting: Oncology

## 2015-11-18 DIAGNOSIS — Z1231 Encounter for screening mammogram for malignant neoplasm of breast: Secondary | ICD-10-CM

## 2016-01-06 DIAGNOSIS — S93402A Sprain of unspecified ligament of left ankle, initial encounter: Secondary | ICD-10-CM | POA: Diagnosis not present

## 2016-04-26 DIAGNOSIS — J988 Other specified respiratory disorders: Secondary | ICD-10-CM | POA: Diagnosis not present

## 2016-07-22 ENCOUNTER — Other Ambulatory Visit: Payer: Self-pay | Admitting: Nurse Practitioner

## 2016-08-11 ENCOUNTER — Encounter: Payer: Self-pay | Admitting: Hematology and Oncology

## 2016-08-11 ENCOUNTER — Other Ambulatory Visit (HOSPITAL_BASED_OUTPATIENT_CLINIC_OR_DEPARTMENT_OTHER): Payer: BLUE CROSS/BLUE SHIELD

## 2016-08-11 ENCOUNTER — Ambulatory Visit (HOSPITAL_BASED_OUTPATIENT_CLINIC_OR_DEPARTMENT_OTHER): Payer: BLUE CROSS/BLUE SHIELD | Admitting: Hematology and Oncology

## 2016-08-11 ENCOUNTER — Other Ambulatory Visit: Payer: Self-pay | Admitting: Hematology and Oncology

## 2016-08-11 DIAGNOSIS — C562 Malignant neoplasm of left ovary: Secondary | ICD-10-CM

## 2016-08-11 DIAGNOSIS — Z8543 Personal history of malignant neoplasm of ovary: Secondary | ICD-10-CM | POA: Diagnosis not present

## 2016-08-11 LAB — CBC WITH DIFFERENTIAL/PLATELET
BASO%: 0.9 % (ref 0.0–2.0)
BASOS ABS: 0.1 10*3/uL (ref 0.0–0.1)
EOS ABS: 0.2 10*3/uL (ref 0.0–0.5)
EOS%: 2.5 % (ref 0.0–7.0)
HCT: 42.4 % (ref 34.8–46.6)
HEMOGLOBIN: 14.2 g/dL (ref 11.6–15.9)
LYMPH%: 33.3 % (ref 14.0–49.7)
MCH: 30.5 pg (ref 25.1–34.0)
MCHC: 33.5 g/dL (ref 31.5–36.0)
MCV: 91.2 fL (ref 79.5–101.0)
MONO#: 0.6 10*3/uL (ref 0.1–0.9)
MONO%: 8.8 % (ref 0.0–14.0)
NEUT#: 3.5 10*3/uL (ref 1.5–6.5)
NEUT%: 54.5 % (ref 38.4–76.8)
PLATELETS: 325 10*3/uL (ref 145–400)
RBC: 4.65 10*6/uL (ref 3.70–5.45)
RDW: 13.9 % (ref 11.2–14.5)
WBC: 6.4 10*3/uL (ref 3.9–10.3)
lymph#: 2.1 10*3/uL (ref 0.9–3.3)

## 2016-08-11 LAB — COMPREHENSIVE METABOLIC PANEL
ALBUMIN: 4.3 g/dL (ref 3.5–5.0)
ALK PHOS: 69 U/L (ref 40–150)
ALT: 23 U/L (ref 0–55)
ANION GAP: 9 meq/L (ref 3–11)
AST: 16 U/L (ref 5–34)
BILIRUBIN TOTAL: 0.34 mg/dL (ref 0.20–1.20)
BUN: 22.4 mg/dL (ref 7.0–26.0)
CO2: 28 mEq/L (ref 22–29)
Calcium: 10.2 mg/dL (ref 8.4–10.4)
Chloride: 105 mEq/L (ref 98–109)
Creatinine: 0.8 mg/dL (ref 0.6–1.1)
EGFR: 79 mL/min/{1.73_m2} — AB (ref >90)
GLUCOSE: 96 mg/dL (ref 70–140)
POTASSIUM: 4.5 meq/L (ref 3.5–5.1)
SODIUM: 142 meq/L (ref 136–145)
TOTAL PROTEIN: 7 g/dL (ref 6.4–8.3)

## 2016-08-12 LAB — CA 125: CANCER ANTIGEN (CA) 125: 7.3 U/mL (ref 0.0–38.1)

## 2016-08-12 NOTE — Progress Notes (Signed)
[place] progress notes  Patient Care Team: Lawerance Cruel, MD as PCP - General (Family Medicine)  CHIEF COMPLAINTS/PURPOSE OF VISIT:  History of ovarian cancer HISTORY OF PRESENTING ILLNESS:  Jordan Golden 56 y.o. female was transferred to my care after her prior physician has left.  I reviewed the patient's records extensive and collaborated the history with the patient. Summary of her history is as follows:   Ovarian cancer on left ([place])   09/27/2011 Initial Diagnosis    Ovary cancer      10/19/2011 - 12/09/2011 Chemotherapy    3 cycles paclitaxel and carboplatin      History is of pelvic mass, which patient initially appreciated herself in Feb 2013, then evaluation by Dr.Romine which included [place], CT and CA 125 of 15. Surgery was by [facility] 09-20-2011 withTAH/BSO/omentectomy/bilateral pelvic and periaortic lymphadenectomy for FIGO 1A high grade serous carcinoma of ovary, 21 cm with intact capsule and arising in background of borderline serous tumor. She had second opinion consultation with Dr.Roland Barrett prior to deciding to proceed with chemotherapy. She received 3 cycles of adjuvant taxol/carboplatin from 10-19-11 thru 12-09-11. BRCA testing was negative in 2014. Her last CT scan is in October 2013 which show no evidence of disease      She returns for further follow-up. She feels well. Denies abnormal vaginal bleeding. Denies sensation of the abdominal bloating, changes in bowel habits, nausea or vomiting. Her appetite is stable, no recent weight change  MEDICAL HISTORY:  Past Medical History:  Diagnosis Date  . Kidney stones   . Osteoporosis   . OVARIAN CA dx' d7/2013  . Ovarian mass     SURGICAL HISTORY: Past Surgical History:  Procedure Laterality Date  . ABDOMINAL HYSTERECTOMY  09/20/2011   Procedure: HYSTERECTOMY ABDOMINAL;  Surgeon: Janie Morning, MD PHD;  Location: [facility];  Service: Gynecology;  Laterality: N/A;  Total  .  APPENDECTOMY    . BRAIN SURGERY  1996   Due to brain injury  . CESAREAN SECTION  1995  . LAPAROTOMY  09/20/2011   Procedure: EXPLORATORY LAPAROTOMY;  Surgeon: Janie Morning, MD PHD;  Location: [facility];  Service: Gynecology;  Laterality: N/A;  . SALPINGOOPHORECTOMY  09/20/2011   Procedure: SALPINGO OOPHERECTOMY;  Surgeon: Janie Morning, MD PHD;  Location: [facility];  Service: Gynecology;  Laterality: Bilateral;    SOCIAL HISTORY: Social History   Social History  . Marital status: Married    Spouse name: N/A  . Number of children: 1  . Years of education: N/A   Occupational History  . Public librarian    Social History Main Topics  . Smoking status: Never Smoker  . Smokeless tobacco: Never Used  . Alcohol use No  . Drug use: No  . Sexual activity: Not on file   Other Topics Concern  . Not on file   Social History Narrative  . No narrative on file    FAMILY HISTORY: Family History  Problem Relation Age of Onset  . Breast cancer Mother 38  . Diabetes Sister   . Breast cancer Maternal Aunt     diagnosed over 25; had 5 breast primaries  . Breast cancer Maternal Aunt     over age 34; maternal half sister  . Heart disease Father     ALLERGIES:  is allergic to [facility] hcl].  MEDICATIONS:  Current Outpatient Prescriptions  Medication Sig Dispense Refill  . Calcium Carbonate-Vitamin D (CALCIUM + D PO) Take 1 tablet by mouth daily.    Marland Kitchen  Cholecalciferol (VITAMIN D-3) 1000 UNITS CAPS Take 1 capsule by mouth 2 (two) times daily.    . Multiple Vitamins-Minerals (MULTIVITAMIN WITH MINERALS) tablet Take 1 tablet by mouth every morning.     Marland Kitchen OVER THE COUNTER MEDICATION Essential Oils Cellular Vitality Complex 1 tab daily     No current facility-administered medications for this visit.     REVIEW OF SYSTEMS:   Constitutional: Denies fevers, chills or abnormal night sweats Eyes: Denies blurriness of vision, double vision or watery eyes Ears, nose, mouth, throat, and face:  Denies mucositis or sore throat Respiratory: Denies cough, dyspnea or wheezes Cardiovascular: Denies palpitation, chest discomfort or lower extremity swelling Gastrointestinal:  Denies nausea, heartburn or change in bowel habits Skin: Denies abnormal skin rashes Lymphatics: Denies new lymphadenopathy or easy bruising Neurological:Denies numbness, tingling or new weaknesses Behavioral/Psych: Mood is stable, no new changes  All other systems were reviewed with the patient and are negative.  PHYSICAL EXAMINATION: ECOG PERFORMANCE STATUS: 0 - Asymptomatic  Vitals:   08/11/16 1411  BP: 133/77  Pulse: 67  Resp: 20  Temp: 98.1 F (36.7 C)   Filed Weights   08/11/16 1411  Weight: 177 lb 6.4 oz (80.5 kg)    GENERAL:alert, no distress and comfortable SKIN: skin color, texture, turgor are normal, no rashes or significant lesions EYES: normal, conjunctiva are pink and non-injected, sclera clear OROPHARYNX:no exudate, normal lips, buccal mucosa, and tongue  NECK: supple, thyroid normal size, non-tender, without nodularity LYMPH:  no palpable lymphadenopathy in the cervical, axillary or inguinal LUNGS: clear to auscultation and percussion with normal breathing effort HEART: regular rate & rhythm and no murmurs without lower extremity edema ABDOMEN:abdomen soft, non-tender and normal bowel sounds Musculoskeletal:no cyanosis of digits and no clubbing  PSYCH: alert & oriented x 3 with fluent speech NEURO: no focal motor/sensory deficits  LABORATORY DATA:  I have reviewed the data as listed Lab Results  Component Value Date   WBC 6.4 08/11/2016   HGB 14.2 08/11/2016   HCT 42.4 08/11/2016   MCV 91.2 08/11/2016   PLT 325 08/11/2016    Recent Labs  08/11/16 1350  NA 142  K 4.5  CO2 28  GLUCOSE 96  BUN 22.4  CREATININE 0.8  CALCIUM 10.2  PROT 7.0  ALBUMIN 4.3  AST 16  ALT 23  ALKPHOS 69  BILITOT 0.34    ASSESSMENT & PLAN:  Ovarian cancer on left [facility]) The patient has  no signs of disease recurrence. She is considering a long-term cancer survivor. I would discharge her to her family doctor and her local GYN doctor for future follow-up. I reinforced the importance of age appropriate screening programs   No orders of the defined types were placed in this encounter.   All questions were answered. The patient knows to call the clinic with any problems, questions or concerns. I spent 15 minutes counseling the patient face to face. The total time spent in the appointment was 20 minutes and more than 50% was on counseling.     Heath Lark, MD 08/12/2016 8:03 AM

## 2016-08-12 NOTE — Assessment & Plan Note (Signed)
The patient has no signs of disease recurrence. She is considering a long-term cancer survivor. I would discharge her to her family doctor and her local GYN doctor for future follow-up. I reinforced the importance of age appropriate screening programs

## 2016-12-23 DIAGNOSIS — R600 Localized edema: Secondary | ICD-10-CM | POA: Diagnosis not present

## 2016-12-23 DIAGNOSIS — S99922A Unspecified injury of left foot, initial encounter: Secondary | ICD-10-CM | POA: Diagnosis not present

## 2016-12-23 DIAGNOSIS — W19XXXA Unspecified fall, initial encounter: Secondary | ICD-10-CM | POA: Diagnosis not present

## 2016-12-23 DIAGNOSIS — M25572 Pain in left ankle and joints of left foot: Secondary | ICD-10-CM | POA: Diagnosis not present

## 2016-12-23 DIAGNOSIS — M79672 Pain in left foot: Secondary | ICD-10-CM | POA: Diagnosis not present

## 2016-12-23 DIAGNOSIS — Z043 Encounter for examination and observation following other accident: Secondary | ICD-10-CM | POA: Diagnosis not present

## 2017-04-18 ENCOUNTER — Ambulatory Visit: Payer: Self-pay | Admitting: Genetic Counselor

## 2017-04-18 ENCOUNTER — Telehealth: Payer: Self-pay | Admitting: Genetic Counselor

## 2017-04-18 DIAGNOSIS — Z1379 Encounter for other screening for genetic and chromosomal anomalies: Secondary | ICD-10-CM

## 2017-04-18 DIAGNOSIS — C562 Malignant neoplasm of left ovary: Secondary | ICD-10-CM

## 2017-04-18 DIAGNOSIS — Z803 Family history of malignant neoplasm of breast: Secondary | ICD-10-CM

## 2017-04-18 NOTE — Telephone Encounter (Signed)
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  Discussed that this is a prostate cancer gene, and at this time is not known to increase the risk for other cancers.  Discussed that all family members should consider getting tested.  I will create a clinic note and send that and a copy of her test result to her.  Gave phone number in case she had further questions.

## 2017-04-18 NOTE — Progress Notes (Signed)
REFERRING PROVIDER: Heath Lark, MD  PRIMARY PROVIDER:  Lawerance Cruel, MD  PRIMARY REASON FOR VISIT:  1. Genetic testing   2. Ovarian cancer on left ([place])   3. Family history of breast cancer     GENETIC TEST RESULTS   Patient Name: Tennyson Kallen Patient Age: 56 y.o. Encounter Date: 04/18/2017  HPI: Ms. Rieger was previously seen in the [facility] due to a family of cancer and concerns regarding a hereditary predisposition to cancer. Please refer to our prior cancer genetics clinic note for more information regarding Ms. Fabela's medical, social and family histories, and our assessment and recommendations, at the time. Ms. Acheampong genetic test results from December 2013 found a HOXB13 VUS.  This VUS has been upgraded to a pathogenic variant.  Ms. Calandra was called today, April 18, 2017, and were disclosed to her, as were recommendations warranted by these results. These results and recommendations are discussed in more detail below.   FAMILY HISTORY:  We obtained a detailed, 4-generation family history.  Significant diagnoses are listed below: Family History  Problem Relation Age of Onset  . Breast cancer Mother 51  . Diabetes Sister   . Breast cancer Maternal Aunt        diagnosed over 71; had 5 breast primaries  . Breast cancer Maternal Aunt        over age 23; maternal half sister  . Heart disease Father     The patient was diagnosed with ovarian cancer at age 18.  She has two sons and two daughters who are cancer free.  She has four sisters and three brothers.  One brother was stillborn and one sister died at age 66 from complications of diabetes.  The patients mother was diagnosed with breast cancer at age 68 and died at 24.  She had one full sister, two full brothers and two maternal half sisters.  One full sister and half sister developed breast cancer.  The full sister had breast cancer 5 times, each reportedly a new primary.  The patient's  father had eight siblings.  One of his sisters was diagnosed with breast cancer over the age of 25.  There is no other reported cancer history.  Patient's maternal ancestors are of [place] and [place] descent, and paternal ancestors are of [place] and [place] descent. There is no reported Ashkenazi Jewish ancestry. There is  known consanguinity.  The patient's parents are third cousins.  GENETIC TESTING:  At the time of Ms. Outten's visit, we recommended she pursue genetic testing of the Breast/Ovarian cancer genetic test. This test found a Variant of Uncertain Significance (VUS) in HOXB13 called c.251G>A (Gly84Glu).  This VUS was reclassified and a new report was generated.  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   A copy of the test report has been scanned into Epic for review.   CLINICAL CONDITION: Prostate cancer is one of the most common types of cancer diagnosed in men. Hereditary prostate cancer accounts for approximately 5-10% of all prostate cancer. It is defined by the occurrence of prostate cancer in at least three men in a nuclear family, prostate cancer affecting three generations in a family, or two relatives diagnosed before age 8 (PMID: 09381829).  Numerous studies have shown that the c.251G>A (p.Gly84Glu) variant in HOXB13, also known as G84E, is associated with an increased risk of prostate cancer (PMID: 93716967, 89381017,  16109604, 54098119, 14782956, 21308657, 84696295, 28413244, 01027253, 66440347, 42595638). This variant is associated with earlier-onset diagnosis (<55 years) and individuals with this variant are more likely to have a positive family history of prostate cancer (PMID: 75643329, 51884166, 06301601, 09323557, 32202542, 70623762).  Large case control studies and meta-analysis across studies demonstrated that men with this variant  have approximately a 33-60% lifetime risk of prostate cancer compared to the general population risk of 12% (PMID: 83151761, 60737106, 26948546, 27035009, 38182993). Additionally, this variant is present in 0.2% of the general population [employer] ([employer]). Accessed September 2016). The G84E variant is observed at a higher frequency in individuals of European ancestry (0.8%), suggesting it may be a European founder mutation (PMID: 71696789). An individual with this variant will not necessarily develop cancer in his lifetime, but the risk is increased over the general population risk. There may also be an increased risk for other cancer types, however, the current evidence is limited and conflicting (PMID: 38101751, 02585277, 82423536, 14431540, 08676195).  GENE INFORMATION: HOXB13 is a tumor suppressor gene that encodes a transcription factor that is part of the homeobox protein family [employer]. Genetics Home Reference. HOXB13. Accessed October 2018). HOXB13 plays a role in embryonic growth and development, and is particularly expressed in the prostate, where it interacts with androgen receptors (PMID: 09326712, 45809983).  INHERITANCE: Hereditary predisposition to cancer due to the HOXB13 G84E variant has autosomal dominant inheritance. This means that an individual with this variant has a 50% chance of passing it to their offspring. Once such a variant is detected, it is possible to identify at-risk relatives who can pursue testing. Many cases are inherited from a parent, but some cases can occur spontaneously (i.e., an individual with a pathogenic variant has parents who do not have it).  MANAGEMENT: [employer]  (NCCN) currently does not recommend additional prostate cancer screening for individuals with the HOXB13 G84E variant beyond what is recommended for the general population. However, NCCN cautions that cancer screening should  ultimately be guided by personal and family history ([employer]  (NCCN). Prostate Cancer Early Detection. Version 2.2018). In the absence of formal guidelines, a formal urology consultation may be considered.  Possible medical management recommendations for men with a higher risk of prostate cancer include:  Baseline PSA screening at age 14 years or 10 years prior to the youngest prostate cancer diagnosed in the family.  Annual screening or determined by baseline PSA.  An individual's cancer risk and medical management are not determined by genetic test results alone. Overall cancer risk assessment incorporates additional factors, including personal medical history, family history, and any available genetic information that may result in a personalized plan for cancer prevention and surveillance.  Even though data regarding HOXB13 is limited, knowing if the G84E variant is present is advantageous. At-risk relatives can be identified, enabling pursuit of a diagnostic evaluation. Further, the available information regarding hereditary cancer susceptibility genes is constantly evolving and more clinically relevant data regarding HOXB13 are likely to become available in the near future. Awareness of this cancer predisposition encourages patients and their providers to inform at-risk family members, to diligently follow standard screening protocols, and to be vigilant in maintaining close and regular contact with their local genetics clinic in anticipation of new information.  FAMILY MEMBERS:  Since we now know the mutation in Ms. Berline Lopes, we can test at-risk relatives to determine whether or not they have inherited the mutation and are at increased  risk for cancer.  It is important that all of Ms. Uplinger's relatives (both men and women) know of the presence of this gene mutation. Site-specific genetic testing can sort out who in the family is at risk and who is not. We will be  happy to meet with any of the family members or refer them to a genetic counselor in their local area. To locate genetic counselors in other cities, individuals can visit the website of the [employer] ([employer]) and search for a Social worker by zip code.   Ms. Hagg children and siblings have a 50% chance to have inherited this mutation. We recommend they have genetic testing for this same mutation, as identifying the presence of this mutation would allow them to also take advantage of risk-reducing measures.   PLAN:  At this time, we do not feel that Ms. Essman is at a higher risk for cancer due to this genetic change.  However, her female relatives are at risk for developing prostate cancer, especially if they have inherited this mutation.  If female relatives are identified as having this mutation, they should follow the management outlined above.  RESOURCES:   Ms. Tesfaye was mailed patient information about the cancer registry program, Vivia Ewing, out of [employer].  The cancer registry provides a way for researchers and patients to link together.  [employer] also provides a biannual newsletter that provides updated information about hereditary cancer genes and syndromes.  We strongly encouraged Ms. Nachtigal to remain in contact with [place] in cancer genetics on an annual basis so we can update Ms. Silvester's personal and family histories, and inform her of advances in cancer genetics that may be of benefit for the entire family. Ms. Manni knows she is also welcome to call with any questions or concerns, at any time.   Karen P. Florene Glen, [place], [facility]  Certified [employer]  [phone]

## 2017-09-19 DIAGNOSIS — R350 Frequency of micturition: Secondary | ICD-10-CM | POA: Diagnosis not present

## 2017-09-19 DIAGNOSIS — N390 Urinary tract infection, site not specified: Secondary | ICD-10-CM | POA: Diagnosis not present

## 2017-09-20 ENCOUNTER — Emergency Department (HOSPITAL_COMMUNITY)
Admission: EM | Admit: 2017-09-20 | Discharge: 2017-09-20 | Disposition: A | Discharge status: Home / Self Care | Payer: BLUE CROSS/BLUE SHIELD | Attending: Emergency Medicine | Admitting: Emergency Medicine

## 2017-09-20 ENCOUNTER — Encounter (HOSPITAL_COMMUNITY): Payer: Self-pay

## 2017-09-20 ENCOUNTER — Other Ambulatory Visit: Payer: Self-pay

## 2017-09-20 ENCOUNTER — Emergency Department (HOSPITAL_COMMUNITY): Payer: BLUE CROSS/BLUE SHIELD

## 2017-09-20 DIAGNOSIS — N200 Calculus of kidney: Secondary | ICD-10-CM

## 2017-09-20 DIAGNOSIS — R109 Unspecified abdominal pain: Secondary | ICD-10-CM | POA: Diagnosis not present

## 2017-09-20 DIAGNOSIS — R350 Frequency of micturition: Secondary | ICD-10-CM | POA: Diagnosis not present

## 2017-09-20 DIAGNOSIS — Z79899 Other long term (current) drug therapy: Secondary | ICD-10-CM | POA: Diagnosis not present

## 2017-09-20 DIAGNOSIS — K449 Diaphragmatic hernia without obstruction or gangrene: Secondary | ICD-10-CM | POA: Diagnosis not present

## 2017-09-20 DIAGNOSIS — N1330 Unspecified hydronephrosis: Secondary | ICD-10-CM | POA: Diagnosis not present

## 2017-09-20 DIAGNOSIS — R112 Nausea with vomiting, unspecified: Secondary | ICD-10-CM | POA: Diagnosis not present

## 2017-09-20 DIAGNOSIS — R1032 Left lower quadrant pain: Secondary | ICD-10-CM | POA: Diagnosis not present

## 2017-09-20 LAB — CBC WITH DIFFERENTIAL/PLATELET
Basophils Absolute: 0 10*3/uL (ref 0.0–0.1)
Basophils Relative: 0 %
Eosinophils Absolute: 0 10*3/uL (ref 0.0–0.7)
Eosinophils Relative: 0 %
HEMATOCRIT: 40.7 % (ref 36.0–46.0)
HEMOGLOBIN: 13.6 g/dL (ref 12.0–15.0)
Lymphocytes Relative: 11 %
Lymphs Abs: 1.9 10*3/uL (ref 0.7–4.0)
MCH: 30.8 pg (ref 26.0–34.0)
MCHC: 33.4 g/dL (ref 30.0–36.0)
MCV: 92.1 fL (ref 78.0–100.0)
MONOS PCT: 6 %
Monocytes Absolute: 1.1 10*3/uL — ABNORMAL HIGH (ref 0.1–1.0)
NEUTROS ABS: 14.1 10*3/uL — AB (ref 1.7–7.7)
NEUTROS PCT: 83 %
Platelets: 289 10*3/uL (ref 150–400)
RBC: 4.42 MIL/uL (ref 3.87–5.11)
RDW: 13.9 % (ref 11.5–15.5)
WBC: 17.2 10*3/uL — AB (ref 4.0–10.5)

## 2017-09-20 LAB — COMPREHENSIVE METABOLIC PANEL
ALT: 20 U/L (ref 14–54)
ANION GAP: 11 (ref 5–15)
AST: 18 U/L (ref 15–41)
Albumin: 4.1 g/dL (ref 3.5–5.0)
Alkaline Phosphatase: 63 U/L (ref 38–126)
BILIRUBIN TOTAL: 0.6 mg/dL (ref 0.3–1.2)
BUN: 18 mg/dL (ref 6–20)
CO2: 24 mmol/L (ref 22–32)
Calcium: 9.4 mg/dL (ref 8.9–10.3)
Chloride: 101 mmol/L (ref 101–111)
Creatinine, Ser: 1.31 mg/dL — ABNORMAL HIGH (ref 0.44–1.00)
GFR calc Af Amer: 52 mL/min — ABNORMAL LOW (ref >60)
GFR calc non Af Amer: 45 mL/min — ABNORMAL LOW (ref >60)
GLUCOSE: 116 mg/dL — AB (ref 65–99)
POTASSIUM: 4.1 mmol/L (ref 3.5–5.1)
SODIUM: 136 mmol/L (ref 135–145)
TOTAL PROTEIN: 6.7 g/dL (ref 6.5–8.1)

## 2017-09-20 LAB — URINALYSIS, ROUTINE W REFLEX MICROSCOPIC
BILIRUBIN URINE: NEGATIVE
Glucose, UA: NEGATIVE mg/dL
KETONES UR: NEGATIVE mg/dL
LEUKOCYTES UA: NEGATIVE
Nitrite: NEGATIVE
PROTEIN: NEGATIVE mg/dL
SPECIFIC GRAVITY, URINE: 1.004 — AB (ref 1.005–1.030)
pH: 6 (ref 5.0–8.0)

## 2017-09-20 LAB — I-STAT CG4 LACTIC ACID, ED: Lactic Acid, Venous: 0.89 mmol/L (ref 0.5–1.9)

## 2017-09-20 LAB — I-STAT BETA HCG BLOOD, ED (MC, WL, AP ONLY): HCG, QUANTITATIVE: 9.5 m[IU]/mL — AB (ref <5)

## 2017-09-20 LAB — LIPASE, BLOOD: Lipase: 22 U/L (ref 11–51)

## 2017-09-20 MED ORDER — TAMSULOSIN HCL 0.4 MG PO CAPS: 0.4000 mg | ORAL_CAPSULE | Freq: Every day | ORAL | 0 refills | Status: DC

## 2017-09-20 MED ORDER — HYDROCODONE-ACETAMINOPHEN 5-325 MG PO TABS: 1.0000 | ORAL_TABLET | Freq: Four times a day (QID) | ORAL | 0 refills | Status: DC | PRN

## 2017-09-20 MED ORDER — OXYCODONE-ACETAMINOPHEN 5-325 MG PO TABS
1.0000 | ORAL_TABLET | Freq: Once | ORAL | Status: AC
  Administered 2017-09-20: 1 via ORAL
  Filled 2017-09-20: qty 1

## 2017-09-20 MED ORDER — ONDANSETRON HCL 4 MG PO TABS: 4.0000 mg | ORAL_TABLET | Freq: Three times a day (TID) | ORAL | 0 refills | Status: DC | PRN

## 2017-09-20 NOTE — ED Triage Notes (Signed)
Pt has LLQ abd pain radiating to the left side of her back. Pt was seen by PCP yesterday and was given meds for possible UTI. Pt denies improvement and states pain has gotten worse. Pt reports some nausea, no vomiting.

## 2017-09-20 NOTE — ED Provider Notes (Signed)
Patient placed in Quick Look pathway, seen and evaluated   Chief Complaint: abdominal pain  HPI:   Pain to left lower abdomen that moves to left flank since Monday. Went to [facility] walk in clinic and given abx for UTI x 2 days. Pain worsening. A/w dysuria, urinary frequency, darker urine, nausea, chills, fever (100.5) this morning. H/o kidney stone in past. H/o abdominal surgeries.   ROS: No changes in BM  Physical Exam:   Gen: No distress  Neuro: Awake and Alert  Skin: Warm    Focused Exam: TTP to suprapubic and L CVAT. Abdomen soft non distended. Non toxic appearance.    Initiation of care has begun. The patient has been counseled on the process, plan, and necessity for staying for the completion/evaluation, and the remainder of the medical screening examination    Kinnie Feil, PA-C 09/20/17 [place], [place], DO 09/20/17 2346

## 2017-09-20 NOTE — ED Provider Notes (Addendum)
[facility] EMERGENCY DEPARTMENT Provider Note   CSN: [id number] Arrival date & time: 09/20/17  1534     History   Chief Complaint Chief Complaint  Patient presents with  . Flank Pain    HPI Jordan Golden is a 57 y.o. female.  HPI   57 year old female with prior history of kidney stones, ovarian mass, prior abdominal hysterectomy and appendectomy presenting for evaluation of flank pain.  Patient report for the past 2-3 days she has had pain to her left lower abdomen radiates to her left flank.  Pain is sharp, crampy, waxing and waning, currently rated as 4 out of 10.  She endorsed a fever T-max 100.5 that started today.  States she endorsed nausea without vomiting, as well as having increased urinary frequency, urgency and burning on urination.  She was seen by her PCP yesterday.  States that a urine culture was sent and patient was placed on some kind of antibiotic.  Her pain did intensify today prompting her to be seen in the ER.  Pain felt similar to prior kidney stones that she had 5-6 years ago.  She has never had to have interventions such as lithotripsy or stent placement.  She did take Advil for her pain.  Past Medical History:  Diagnosis Date  . Kidney stones   . Osteoporosis   . OVARIAN CA dx' d7/2013  . Ovarian mass     Patient Active Problem List   Diagnosis Date Noted  . Family history of breast cancer 04/18/2017  . Dense breast tissue 08/12/2015  . Breast cancer screening, high risk patient 08/12/2015  . Genetic testing 07/16/2014  . BRCA negative 07/24/2013  . T12 compression fracture ([place]) 10/11/2011  . Skull fracture ([place]) 10/11/2011  . H/O cesarean section 10/11/2011  . History of appendectomy 10/11/2011  . Ovarian cancer on left ([place]) 09/27/2011  . Mass of ovary 08/25/2011    Past Surgical History:  Procedure Laterality Date  . ABDOMINAL HYSTERECTOMY  09/20/2011   Procedure: HYSTERECTOMY ABDOMINAL;  Surgeon: Janie Morning, MD PHD;   Location: [facility];  Service: Gynecology;  Laterality: N/A;  Total  . APPENDECTOMY    . BRAIN SURGERY  1996   Due to brain injury  . CESAREAN SECTION  1995  . LAPAROTOMY  09/20/2011   Procedure: EXPLORATORY LAPAROTOMY;  Surgeon: Janie Morning, MD PHD;  Location: [facility];  Service: Gynecology;  Laterality: N/A;  . SALPINGOOPHORECTOMY  09/20/2011   Procedure: SALPINGO OOPHERECTOMY;  Surgeon: Janie Morning, MD PHD;  Location: [facility];  Service: Gynecology;  Laterality: Bilateral;    OB History    No data available       Home Medications    Prior to Admission medications   Medication Sig Start Date End Date Taking? Authorizing Provider  Calcium Carbonate-Vitamin D (CALCIUM + D PO) Take 1 tablet by mouth daily.    [provider]  Cholecalciferol (VITAMIN D-3) 1000 UNITS CAPS Take 1 capsule by mouth 2 (two) times daily. 04/03/13   Lawerance Cruel, MD  Multiple Vitamins-Minerals (MULTIVITAMIN WITH MINERALS) tablet Take 1 tablet by mouth every morning.     [provider]  OVER THE COUNTER MEDICATION Essential Oils Cellular Vitality Complex 1 tab daily    [provider]  CALCIUM-VITAMIN D PO Take 1 tablet by mouth daily.  09/10/11  [provider]    Family History Family History  Problem Relation Age of Onset  . Breast cancer Mother 19  . Diabetes Sister   .  Breast cancer Maternal Aunt        diagnosed over 62; had 5 breast primaries  . Breast cancer Maternal Aunt        over age 56; maternal half sister  . Heart disease Father     Social History Social History   Tobacco Use  . Smoking status: Never Smoker  . Smokeless tobacco: Never Used  Substance Use Topics  . Alcohol use: No  . Drug use: No     Allergies   Ultram [tramadol hcl]   Review of Systems Review of Systems  All other systems reviewed and are negative.    Physical Exam Updated Vital Signs BP (!) 152/87   Pulse 86   Temp 99.2 F (37.3 C) (Oral)   Resp 16    SpO2 99%   Physical Exam  Constitutional: She appears well-developed and well-nourished. No distress.  HENT:  Head: Atraumatic.  Eyes: Conjunctivae are normal.  Neck: Neck supple.  Cardiovascular: Normal rate and regular rhythm.  Pulmonary/Chest: Effort normal and breath sounds normal.  Abdominal: Soft. Bowel sounds are normal. She exhibits no distension. There is no tenderness.  Genitourinary:  Genitourinary Comments: Left CVA tenderness on percussion.  Neurological: She is alert.  Skin: No rash noted.  Psychiatric: She has a normal mood and affect.  Nursing note and vitals reviewed.    ED Treatments / Results  Labs (all labs ordered are listed, but only abnormal results are displayed) Labs Reviewed  CBC WITH DIFFERENTIAL/PLATELET - Abnormal; Notable for the following components:      Result Value   WBC 17.2 (*)    Neutro Abs 14.1 (*)    Monocytes Absolute 1.1 (*)    All other components within normal limits  I-STAT BETA HCG BLOOD, ED ([facility], [facility], [facility] ONLY) - Abnormal; Notable for the following components:   I-stat hCG, quantitative 9.5 (*)    All other components within normal limits  COMPREHENSIVE METABOLIC PANEL  LIPASE, BLOOD  URINALYSIS, ROUTINE W REFLEX MICROSCOPIC  I-STAT CG4 LACTIC ACID, ED    EKG  EKG Interpretation None       Radiology Ct Renal Stone Study  Result Date: 09/20/2017 CLINICAL DATA:  Left-sided flank pain with nausea EXAM: CT ABDOMEN AND PELVIS WITHOUT CONTRAST TECHNIQUE: Multidetector CT imaging of the abdomen and pelvis was performed following the standard protocol without IV contrast. COMPARISON:  04/25/2012 FINDINGS: Lower chest: Lung bases demonstrate a stable 3 mm right lower lobe lung nodule, benign. Patchy ground-glass density in the posterior lung bases, likely atelectasis. Normal heart size. Small hiatal hernia. Hepatobiliary: Subcentimeter hypodensity in the left lobe, stable to minimal increase in size since 2013. No gallstones,  gallbladder wall thickening, or biliary dilatation. Pancreas: Unremarkable. No pancreatic ductal dilatation or surrounding inflammatory changes. Spleen: Normal in size without focal abnormality. Adrenals/Urinary Tract: Adrenal glands are within normal limits. 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 There is mild hydronephrosis lower pole of the right kidney but no ureteral dilatation and no definite right ureteral stone. Moderate to marked hydronephrosis of the left kidney, mostly involving the mid to inferior pole. Both left ureters are dilated, ureters probably fuse just proximal to the bladder insertion. 5 mm stone at the left UVJ. Bladder otherwise normal. Stomach/Bowel: Stomach is within normal limits. Appendix not well seen. No evidence of bowel wall thickening, distention, or inflammatory changes. Vascular/Lymphatic: Nonaneurysmal aorta. High bifurcation of the aorta. No significantly enlarged lymph nodes. Reproductive: Status post hysterectomy. No adnexal masses. Other:  Negative for free air or significant free fluid. Small fat in the umbilical region. Musculoskeletal: No acute or significant osseous findings. IMPRESSION: 1. 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 There is moderate to marked hydronephrosis of the left kidney, greatest involving the mid and lower pole. There is dilatation of both left ureters which are suspected to fuse prior to the bladder insertion. There is an obstructing 5 mm stone at the left UVJ. 2. There is mild hydronephrosis of the lower pole of the right kidney but no hydroureter and no definitive stone at the right ureter. Electronically Signed   By: Donavan Foil M.D.   On: 09/20/2017 16:56    Procedures Procedures (including critical care time)  Medications Ordered in ED Medications  oxyCODONE-acetaminophen (PERCOCET/ROXICET) 5-325 MG per tablet 1 tablet (1 tablet Oral Given 09/20/17  1849)     Initial Impression / Assessment and Plan / ED Course  I have reviewed the triage vital signs and the nursing notes.  Pertinent labs & imaging results that were available during my care of the patient were reviewed by me and considered in my medical decision making (see chart for details).     BP (!) 141/87 (BP Location: Right Arm)   Pulse 89   Temp 99.2 F (37.3 C) (Oral)   Resp 18   SpO2 98%    Final Clinical Impressions(s) / ED Diagnoses   Final diagnoses:  Kidney stone on left side    ED Discharge Orders        Ordered    HYDROcodone-acetaminophen (NORCO/VICODIN) 5-325 MG tablet  Every 6 hours PRN     09/20/17 1935    ondansetron (ZOFRAN) 4 MG tablet  Every 8 hours PRN     09/20/17 1935    tamsulosin (FLOMAX) 0.4 MG CAPS capsule  Daily     09/20/17 1935     Patient with history of kidney stones presenting with left flank pain.  She has an elevated white count of 17.2  an an abdominal and pelvic CT scan demonstrating moderate to marked hydronephrosis of the left kidney greatest involving the mid and lower pole.  There is dilatations of both left ureter which is suspected diffuse prior to the bladder insertion.  There is an obstruction 5 mm stones at the left UVJ.  There is mild hydronephrosis of the lower pole of the right kidney but no hydroureter and no definitive stone on the right ureter.  Her pain is controlled currently.  Her urine have not resulted yet.  She has normal lactic acid.  I offered pain medication but patient declined.  7:56 PM  Elevated WBC of 17.2, likely stress  demargination.  Urine without signs of urinary tract infection.  Mildly elevated i-STAT hCG of 9.5, this is not related to pregnancy as patient has had prior hysterectomy. Mild AKI with creatinine of 1.31.IV fluid offered, patient declined.  At this time her pain is minimal and she request to be discharged.  She will follow-up promptly with urologist for further care.  Return  precautions discussed.  Domenic Moras, PA-C 09/20/17 1957  Domenic Moras, PA-C 09/20/17 Derry Skill, MD 09/21/17 [phone]

## 2017-09-20 NOTE — Discharge Instructions (Signed)
You have a 72mm kidney stone near your bladder.  You should be able to pass this stone.  Take pain medication and antinausea medication as needed.  Follow up with urologist for further care.  Return if you have any concerns.

## 2018-04-24 ENCOUNTER — Other Ambulatory Visit: Payer: Self-pay

## 2018-04-24 ENCOUNTER — Ambulatory Visit (INDEPENDENT_AMBULATORY_CARE_PROVIDER_SITE_OTHER): Payer: BLUE CROSS/BLUE SHIELD | Admitting: Obstetrics and Gynecology

## 2018-04-24 ENCOUNTER — Encounter: Payer: Self-pay | Admitting: Obstetrics and Gynecology

## 2018-04-24 VITALS — BP 122/88 | HR 64 | Ht 65.75 in | Wt 178.2 lb

## 2018-04-24 DIAGNOSIS — Z833 Family history of diabetes mellitus: Secondary | ICD-10-CM

## 2018-04-24 DIAGNOSIS — Z8543 Personal history of malignant neoplasm of ovary: Secondary | ICD-10-CM | POA: Diagnosis not present

## 2018-04-24 DIAGNOSIS — Z01419 Encounter for gynecological examination (general) (routine) without abnormal findings: Secondary | ICD-10-CM

## 2018-04-24 DIAGNOSIS — M810 Age-related osteoporosis without current pathological fracture: Secondary | ICD-10-CM

## 2018-04-24 DIAGNOSIS — Z0000 Encounter for general adult medical examination without abnormal findings: Secondary | ICD-10-CM | POA: Diagnosis not present

## 2018-04-24 NOTE — Patient Instructions (Signed)

## 2018-04-24 NOTE — Progress Notes (Signed)
57 y.o. [id number] Married White or Caucasian Not Hispanic or Latino female here for annual exam.  H/O ovarian cancer at 44, s/p TAH/BSO/Omentectomy/LND. Pathology with low malignant potential ovarian cancer with a solid nodule of ovarian adenocarcinoma.  She also did 3 rounds of chemotherapy. She is BRCA negative. Positive for HOXB13 VUS, may be linked to prostate cancer.  Sexually active, no pain. No bowel or bladder c/o. H/O osteoporosis, was previously on treatment, went off in 2012 for a "year". Given the cancer it wasn't addressed again.      No LMP recorded. Patient has had a hysterectomy.          Sexually active: Yes.    The current method of family planning is status post hysterectomy.    Exercising: Yes.    walking Smoker:  no  Health Maintenance: Pap: Patient is unsure  History of abnormal Pap:  No MMG:  11/18/2015 Birads 1 negative, next scheduled for 04/25/2018 BMD:   08/19/2014 osteoorosis Colonoscopy: 2016 WNL, repeat in 10 years per patient TDaP:  Unsure, 2010 Gardasil: N/A   reports that she has never smoked. She has never used smokeless tobacco. She reports that she does not drink alcohol or use drugs. 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 All married, has 4 living grandchildren. Lost 23 week twins after a short life. Grandson died last year of hypoplastic left heart. All live within 1.5 hours.   Past Medical History:  Diagnosis Date  . Kidney stones   . Osteoporosis   . OVARIAN CA dx' d7/2013  . Ovarian mass     Past Surgical History:  Procedure Laterality Date  . ABDOMINAL HYSTERECTOMY  09/20/2011   Procedure: HYSTERECTOMY ABDOMINAL;  Surgeon: Janie Morning, MD PHD;  Location: [facility];  Service: Gynecology;  Laterality: N/A;  Total  . APPENDECTOMY    . BRAIN SURGERY  1996   Due to brain injury  . CESAREAN SECTION  1995  . LAPAROTOMY  09/20/2011   Procedure: EXPLORATORY LAPAROTOMY;  Surgeon: Janie Morning, MD PHD;  Location: [facility];  Service:  Gynecology;  Laterality: N/A;  . SALPINGOOPHORECTOMY  09/20/2011   Procedure: SALPINGO OOPHERECTOMY;  Surgeon: Janie Morning, MD PHD;  Location: [facility];  Service: Gynecology;  Laterality: Bilateral;  3 NSVD, then one C/S for breech  Current Outpatient Medications  Medication Sig Dispense Refill  . Calcium Carbonate-Vitamin D (CALCIUM + D PO) Take 1 tablet by mouth daily.    . Cholecalciferol (VITAMIN D-3) 1000 UNITS CAPS Take 1 capsule by mouth 2 (two) times daily.    . Multiple Vitamins-Minerals (MULTIVITAMIN WITH MINERALS) tablet Take 1 tablet by mouth every morning.      No current facility-administered medications for this visit.     Family History  Problem Relation Age of Onset  . Breast cancer Mother 56  . Diabetes Sister   . Breast cancer Maternal Aunt        diagnosed over 87; had 5 breast primaries  . Breast cancer Maternal Aunt        over age 30; maternal half sister  . Heart disease Father   . Diabetes Father     Review of Systems  Constitutional: Negative.   HENT: Negative.   Eyes: Negative.   Respiratory: Negative.   Cardiovascular: Negative.   Gastrointestinal: Negative.   Endocrine: Negative.   Genitourinary: Negative.   Musculoskeletal: Negative.   Skin: Negative.   Allergic/Immunologic: Negative.   Neurological: Negative.   Hematological: Negative.  Psychiatric/Behavioral: Negative.     Exam:   BP 122/88 (BP Location: Right Arm, Patient Position: Sitting, Cuff Size: Normal)   Pulse 64   Ht 5' 5.75" (1.67 m)   Wt 178 lb 3.2 oz (80.8 kg)   BMI 28.98 kg/m   Weight change: @WEIGHTCHANGE @ Height:   Height: 5' 5.75" (167 cm)  Ht Readings from Last 3 Encounters:  04/24/18 5' 5.75" (1.67 m)  08/11/16 5' 6"  (1.676 m)  10/05/15 5' 6"  (1.676 m)    General appearance: alert, cooperative and appears stated age Head: Normocephalic, without obvious abnormality, atraumatic Neck: no adenopathy, supple, symmetrical, trachea midline and thyroid normal to  inspection and palpation Lungs: clear to auscultation bilaterally Cardiovascular: regular rate and rhythm Breasts: normal appearance, no masses or tenderness Abdomen: soft, non-tender; non distended,  no masses,  no organomegaly Extremities: extremities normal, atraumatic, no cyanosis or edema Skin: Skin color, texture, turgor normal. No rashes or lesions Lymph nodes: Cervical, supraclavicular, and axillary nodes normal. No abnormal inguinal nodes palpated Neurologic: Grossly normal   Pelvic: External genitalia:  no lesions              Urethra:  normal appearing urethra with no masses, tenderness or lesions              Bartholins and Skenes: normal                 Vagina: atrophic appearing vagina with normal color and discharge, no lesions              Cervix: absent               Bimanual Exam:  Uterus:  uterus absent              Adnexa: no mass, fullness, tenderness               Rectovaginal: Confirms               Anus:  normal sphincter tone, no lesions  Chaperone was present for exam.  A:  Well Woman with normal exam  History of ovarian cancer, Stage 1A, over 5 years cancer free  History of osteoporosis  FH of DM  Mild vaginal atrophy, not symptomatic  P:   No pap   Discussed breast self exam  Discussed calcium and vit D intake  DEXA ordered  Screening labs  Colonoscopy UTD  Mammogram tomorrow

## 2018-04-25 ENCOUNTER — Ambulatory Visit
Admission: RE | Admit: 2018-04-25 | Discharge: 2018-04-25 | Disposition: A | Discharge status: Home / Self Care | Payer: BLUE CROSS/BLUE SHIELD | Source: Ambulatory Visit

## 2018-04-25 ENCOUNTER — Other Ambulatory Visit: Payer: Self-pay | Admitting: Obstetrics and Gynecology

## 2018-04-25 DIAGNOSIS — Z1231 Encounter for screening mammogram for malignant neoplasm of breast: Secondary | ICD-10-CM | POA: Diagnosis not present

## 2018-04-25 LAB — CBC
Hematocrit: 42 % (ref 34.0–46.6)
Hemoglobin: 13.8 g/dL (ref 11.1–15.9)
MCH: 30.1 pg (ref 26.6–33.0)
MCHC: 32.9 g/dL (ref 31.5–35.7)
MCV: 92 fL (ref 79–97)
Platelets: 356 10*3/uL (ref 150–450)
RBC: 4.59 x10E6/uL (ref 3.77–5.28)
RDW: 14.6 % (ref 12.3–15.4)
WBC: 5.5 10*3/uL (ref 3.4–10.8)

## 2018-04-25 LAB — COMPREHENSIVE METABOLIC PANEL
A/G RATIO: 2.1 (ref 1.2–2.2)
ALK PHOS: 53 IU/L (ref 39–117)
ALT: 11 IU/L (ref 0–32)
AST: 15 IU/L (ref 0–40)
Albumin: 4.4 g/dL (ref 3.5–5.5)
BILIRUBIN TOTAL: 0.3 mg/dL (ref 0.0–1.2)
BUN / CREAT RATIO: 22 (ref 9–23)
BUN: 15 mg/dL (ref 6–24)
CHLORIDE: 107 mmol/L — AB (ref 96–106)
CO2: 24 mmol/L (ref 20–29)
Calcium: 9.7 mg/dL (ref 8.7–10.2)
Creatinine, Ser: 0.69 mg/dL (ref 0.57–1.00)
GFR calc non Af Amer: 98 mL/min/{1.73_m2} (ref >59)
GFR, EST AFRICAN AMERICAN: 113 mL/min/{1.73_m2} (ref >59)
GLUCOSE: 82 mg/dL (ref 65–99)
Globulin, Total: 2.1 g/dL (ref 1.5–4.5)
Potassium: 4.7 mmol/L (ref 3.5–5.2)
Sodium: 140 mmol/L (ref 134–144)
TOTAL PROTEIN: 6.5 g/dL (ref 6.0–8.5)

## 2018-04-25 LAB — LIPID PANEL
CHOL/HDL RATIO: 2.8 ratio (ref 0.0–4.4)
Cholesterol, Total: 151 mg/dL (ref 100–199)
HDL: 53 mg/dL (ref >39)
LDL CALC: 84 mg/dL (ref 0–99)
TRIGLYCERIDES: 68 mg/dL (ref 0–149)
VLDL Cholesterol Cal: 14 mg/dL (ref 5–40)

## 2018-04-25 LAB — VITAMIN D 25 HYDROXY (VIT D DEFICIENCY, FRACTURES): VIT D 25 HYDROXY: 38.5 ng/mL (ref 30.0–100.0)

## 2018-04-25 LAB — HEMOGLOBIN A1C
Est. average glucose Bld gHb Est-mCnc: 108 mg/dL
Hgb A1c MFr Bld: 5.4 % (ref 4.8–5.6)

## 2018-04-30 ENCOUNTER — Ambulatory Visit
Admission: RE | Admit: 2018-04-30 | Discharge: 2018-04-30 | Disposition: A | Discharge status: Home / Self Care | Payer: BLUE CROSS/BLUE SHIELD | Source: Ambulatory Visit | Attending: Obstetrics and Gynecology | Admitting: Obstetrics and Gynecology

## 2018-04-30 DIAGNOSIS — Z780 Asymptomatic menopausal state: Secondary | ICD-10-CM | POA: Diagnosis not present

## 2018-04-30 DIAGNOSIS — M85852 Other specified disorders of bone density and structure, left thigh: Secondary | ICD-10-CM | POA: Diagnosis not present

## 2018-04-30 DIAGNOSIS — M810 Age-related osteoporosis without current pathological fracture: Secondary | ICD-10-CM

## 2018-05-07 NOTE — Progress Notes (Signed)
GYNECOLOGY  VISIT   HPI: 57 y.o.   Married White or Caucasian Not Hispanic or Latino  female   [phone] with No LMP recorded. Patient has had a hysterectomy.   here for BMD consult. Recent dexa with a T score of -2.9 in her spine, -2.1 in her hip. She was previously on actonel and fosamax, but went off with her diagnosis of cancer over 5 years ago.  Patient with h/o ovarian cancer at 32, s/p TAH/BSO/Omentectomy/LND.  She is on calcium and vit d, exercising. Recent normal labs.   GYNECOLOGIC HISTORY: No LMP recorded. Patient has had a hysterectomy. Contraception: Hysterectomy Menopausal hormone therapy: None        OB History    Gravida  4   Para  4   Term  4   Preterm  0   AB  0   Living  4     SAB  0   TAB  0   Ectopic  0   Multiple  0   Live Births  4              Patient Active Problem List   Diagnosis Date Noted  . Family history of breast cancer 04/18/2017  . Dense breast tissue 08/12/2015  . Breast cancer screening, high risk patient 08/12/2015  . Genetic testing 07/16/2014  . BRCA negative 07/24/2013  . T12 compression fracture ([place]) 10/11/2011  . Skull fracture ([place]) 10/11/2011  . H/O cesarean section 10/11/2011  . History of appendectomy 10/11/2011  . Ovarian cancer on left ([place]) 09/27/2011  . Mass of ovary 08/25/2011    Past Medical History:  Diagnosis Date  . Kidney stones   . Osteoporosis   . OVARIAN CA dx' d7/2013  . Ovarian mass     Past Surgical History:  Procedure Laterality Date  . ABDOMINAL HYSTERECTOMY  09/20/2011   Procedure: HYSTERECTOMY ABDOMINAL;  Surgeon: Janie Morning, MD PHD;  Location: [facility];  Service: Gynecology;  Laterality: N/A;  Total  . APPENDECTOMY    . BRAIN SURGERY  1996   Due to brain injury  . CESAREAN SECTION  1995  . LAPAROTOMY  09/20/2011   Procedure: EXPLORATORY LAPAROTOMY;  Surgeon: Janie Morning, MD PHD;  [facility];  Service: Gynecology;  Laterality: N/A;  . SALPINGOOPHORECTOMY  09/20/2011   Procedure: SALPINGO OOPHERECTOMY;  Surgeon: Janie Morning, MD PHD;  Location: [facility];  Service: Gynecology;  Laterality: Bilateral;    Current Outpatient Medications  Medication Sig Dispense Refill  . Calcium Carbonate-Vitamin D (CALCIUM + D PO) Take 1 tablet by mouth daily.    . Cholecalciferol (VITAMIN D-3) 1000 UNITS CAPS Take 1 capsule by mouth 2 (two) times daily.    . Multiple Vitamins-Minerals (MULTIVITAMIN WITH MINERALS) tablet Take 1 tablet by mouth every morning.      No current facility-administered medications for this visit.      ALLERGIES: Ultram [tramadol hcl]  Family History  Problem Relation Age of Onset  . Breast cancer Mother 49  . Diabetes Sister   . Breast cancer Maternal Aunt        diagnosed over 80; had 5 breast primaries  . Breast cancer Maternal Aunt        over age 73; maternal half sister  . Heart disease Father   . Diabetes Father     Social History   Socioeconomic History  . Marital status: Married    Spouse name: Not on file  . Number of children: 1  .  Years of education: Not on file  . Highest education level: Not on file  Occupational History  . Occupation: Public librarian  Social Needs  . Financial resource strain: Not on file  . Food insecurity:    Worry: Not on file    Inability: Not on file  . Transportation needs:    Medical: Not on file    Non-medical: Not on file  Tobacco Use  . Smoking status: Never Smoker  . Smokeless tobacco: Never Used  Substance and Sexual Activity  . Alcohol use: No  . Drug use: No  . Sexual activity: Yes    Birth control/protection: Post-menopausal  Lifestyle  . Physical activity:    Days per week: Not on file    Minutes per session: Not on file  . Stress: Not on file  Relationships  . Social connections:    Talks on phone: Not on file    Gets together: Not on file    Attends religious service: Not on file    Active member of club or organization: Not on file    Attends meetings of clubs or  organizations: Not on file    Relationship status: Not on file  . Intimate partner violence:    Fear of current or ex partner: Not on file    Emotionally abused: Not on file    Physically abused: Not on file    Forced sexual activity: Not on file  Other Topics Concern  . Not on file  Social History Narrative  . Not on file    Review of Systems  Constitutional: Negative.   HENT: Negative.   Eyes: Negative.   Respiratory: Negative.   Cardiovascular: Negative.   Gastrointestinal: Negative.   Genitourinary: Negative.   Musculoskeletal: Negative.   Skin: Negative.   Neurological: Negative.   Endo/Heme/Allergies: Negative.   Psychiatric/Behavioral: Negative.     PHYSICAL EXAMINATION:    BP 110/82 (BP Location: Right Arm, Patient Position: Sitting, Cuff Size: Normal)   Pulse 62   Ht 5' 5.75" (1.67 m)   Wt 178 lb 6.4 oz (80.9 kg)   BMI 29.02 kg/m     General appearance: alert, cooperative and appears stated age  ASSESSMENT Osteoporosis, reviewed DEXA results.    PLAN Continue calcium, vit D and regular exercise Start Fosamax, reviewed risks Discussed avoiding falls Information given   An After Visit Summary was printed and given to the patient.

## 2018-05-08 ENCOUNTER — Ambulatory Visit (INDEPENDENT_AMBULATORY_CARE_PROVIDER_SITE_OTHER): Payer: BLUE CROSS/BLUE SHIELD | Admitting: Obstetrics and Gynecology

## 2018-05-08 ENCOUNTER — Encounter: Payer: Self-pay | Admitting: Obstetrics and Gynecology

## 2018-05-08 ENCOUNTER — Other Ambulatory Visit: Payer: Self-pay

## 2018-05-08 VITALS — BP 110/82 | HR 62 | Ht 65.75 in | Wt 178.4 lb

## 2018-05-08 DIAGNOSIS — M810 Age-related osteoporosis without current pathological fracture: Secondary | ICD-10-CM | POA: Diagnosis not present

## 2018-05-08 MED ORDER — ALENDRONATE SODIUM 70 MG PO TABS: 70.0000 mg | ORAL_TABLET | ORAL | 3 refills | Status: DC

## 2018-05-08 NOTE — Patient Instructions (Signed)
Alendronate tablets What is this medicine? ALENDRONATE (a LEN droe nate) slows calcium loss from bones. It helps to make normal healthy bone and to slow bone loss in people with Paget's disease and osteoporosis. It may be used in others at risk for bone loss. This medicine may be used for other purposes; ask your health care provider or pharmacist if you have questions. COMMON BRAND NAME(S): Fosamax What should I tell my health care provider before I take this medicine? They need to know if you have any of these conditions: -dental disease -esophagus, stomach, or intestine problems, like acid reflux or GERD -kidney disease -low blood calcium -low vitamin D -problems sitting or standing 30 minutes -trouble swallowing -an unusual or allergic reaction to alendronate, other medicines, foods, dyes, or preservatives -pregnant or trying to get pregnant -breast-feeding How should I use this medicine? You must take this medicine exactly as directed or you will lower the amount of the medicine you absorb into your body or you may cause yourself harm. Take this medicine by mouth first thing in the morning, after you are up for the day. Do not eat or drink anything before you take your medicine. Swallow the tablet with a full glass (6 to 8 fluid ounces) of plain water. Do not take this medicine with any other drink. Do not chew or crush the tablet. After taking this medicine, do not eat breakfast, drink, or take any medicines or vitamins for at least 30 minutes. Sit or stand up for at least 30 minutes after you take this medicine; do not lie down. Do not take your medicine more often than directed. Talk to your pediatrician regarding the use of this medicine in children. Special care may be needed. Overdosage: If you think you have taken too much of this medicine contact a poison control center or emergency room at once. NOTE: This medicine is only for you. Do not share this medicine with others. What if I  miss a dose? If you miss a dose, do not take it later in the day. Continue your normal schedule starting the next morning. Do not take double or extra doses. What may interact with this medicine? -aluminum hydroxide -antacids -aspirin -calcium supplements -drugs for inflammation like ibuprofen, naproxen, and others -iron supplements -magnesium supplements -vitamins with minerals This list may not describe all possible interactions. Give your health care provider a list of all the medicines, herbs, non-prescription drugs, or dietary supplements you use. Also tell them if you smoke, drink alcohol, or use illegal drugs. Some items may interact with your medicine. What should I watch for while using this medicine? Visit your doctor or health care professional for regular checks ups. It may be some time before you see benefit from this medicine. Do not stop taking your medicine except on your doctor's advice. Your doctor or health care professional may order blood tests and other tests to see how you are doing. You should make sure you get enough calcium and vitamin D while you are taking this medicine, unless your doctor tells you not to. Discuss the foods you eat and the vitamins you take with your health care professional. Some people who take this medicine have severe bone, joint, and/or muscle pain. This medicine may also increase your risk for a broken thigh bone. Tell your doctor right away if you have pain in your upper leg or groin. Tell your doctor if you have any pain that does not go away or that gets worse.  This medicine can make you more sensitive to the sun. If you get a rash while taking this medicine, sunlight may cause the rash to get worse. Keep out of the sun. If you cannot avoid being in the sun, wear protective clothing and use sunscreen. Do not use sun lamps or tanning beds/booths. What side effects may I notice from receiving this medicine? Side effects that you should report to  your doctor or health care professional as soon as possible: -allergic reactions like skin rash, itching or hives, swelling of the face, lips, or tongue -black or tarry stools -bone, muscle or joint pain -changes in vision -chest pain -heartburn or stomach pain -jaw pain, especially after dental work -pain or trouble when swallowing -redness, blistering, peeling or loosening of the skin, including inside the mouth Side effects that usually do not require medical attention (report to your doctor or health care professional if they continue or are bothersome): -changes in taste -diarrhea or constipation -eye pain or itching -headache -nausea or vomiting -stomach gas or fullness This list may not describe all possible side effects. Call your doctor for medical advice about side effects. You may report side effects to FDA at 1-800-FDA-1088. Where should I keep my medicine? Keep out of the reach of children. Store at room temperature of 15 and 30 degrees C (59 and 86 degrees F). Throw away any unused medicine after the expiration date. NOTE: This sheet is a summary. It may not cover all possible information. If you have questions about this medicine, talk to your doctor, pharmacist, or health care provider.  2018 Elsevier/Gold Standard (2010-12-17 08:56:09) Osteoporosis Osteoporosis is the thinning and loss of density in the bones. Osteoporosis makes the bones more brittle, fragile, and likely to break (fracture). Over time, osteoporosis can cause the bones to become so weak that they fracture after a simple fall. The bones most likely to fracture are the bones in the hip, wrist, and spine. What are the causes? The exact cause is not known. What increases the risk? Anyone can develop osteoporosis. You may be at greater risk if you have a family history of the condition or have poor nutrition. You may also have a higher risk if you are:  Female.  55 years old or older.  A smoker.  Not  physically active.  White or Asian.  Slender.  What are the signs or symptoms? A fracture might be the first sign of the disease, especially if it results from a fall or injury that would not usually cause a bone to break. Other signs and symptoms include:  Low back and neck pain.  Stooped posture.  Height loss.  How is this diagnosed? To make a diagnosis, your health care provider may:  Take a medical history.  Perform a physical exam.  Order tests, such as: ? A bone mineral density test. ? A dual-energy X-ray absorptiometry test.  How is this treated? The goal of osteoporosis treatment is to strengthen your bones to reduce your risk of a fracture. Treatment may involve:  Making lifestyle changes, such as: ? Eating a diet rich in calcium. ? Doing weight-bearing and muscle-strengthening exercises. ? Stopping tobacco use. ? Limiting alcohol intake.  Taking medicine to slow the process of bone loss or to increase bone density.  Monitoring your levels of calcium and vitamin D.  Follow these instructions at home:  Include calcium and vitamin D in your diet. Calcium is important for bone health, and vitamin D helps the body  absorb calcium.  Perform weight-bearing and muscle-strengthening exercises as directed by your health care provider.  Do not use any tobacco products, including cigarettes, chewing tobacco, and electronic cigarettes. If you need help quitting, ask your health care provider.  Limit your alcohol intake.  Take medicines only as directed by your health care provider.  Keep all follow-up visits as directed by your health care provider. This is important.  Take precautions at home to lower your risk of falling, such as: ? Keeping rooms well lit and clutter free. ? Installing safety rails on stairs. ? Using rubber mats in the bathroom and other areas that are often wet or slippery. Get help right away if: You fall or injure yourself. This information  is not intended to replace advice given to you by your health care provider. Make sure you discuss any questions you have with your health care provider. Document Released: 03/30/2005 Document Revised: 11/23/2015 Document Reviewed: 11/28/2013 Elsevier Interactive Patient Education  [employer].

## 2019-01-08 DIAGNOSIS — H40013 Open angle with borderline findings, low risk, bilateral: Secondary | ICD-10-CM | POA: Diagnosis not present

## 2019-02-05 DIAGNOSIS — Z20828 Contact with and (suspected) exposure to other viral communicable diseases: Secondary | ICD-10-CM | POA: Diagnosis not present

## 2019-04-29 ENCOUNTER — Other Ambulatory Visit: Payer: Self-pay | Admitting: Obstetrics and Gynecology

## 2019-04-29 DIAGNOSIS — Z1231 Encounter for screening mammogram for malignant neoplasm of breast: Secondary | ICD-10-CM

## 2019-04-30 ENCOUNTER — Other Ambulatory Visit: Payer: Self-pay

## 2019-04-30 NOTE — Progress Notes (Signed)
58 y.o. [id number] Married White or Caucasian Not Hispanic or Latino female here for annual exam.    H/O stage 1A ovarian cancer at 41, s/p TAH/BSO/Omentectomy/LND. S/p 3 rounds of chemo.   She is BRCA negative. Positive for HOXB13 VUS, may be linked to prostate cancer.   H/O osteoporosis, started on Fosamax last year. Doing well.     No LMP recorded. Patient has had a hysterectomy.          Sexually active: Yes.    The current method of family planning is post menopausal status.    Exercising: Yes.    3 times per week walking Smoker:  no  Health Maintenance: Pap: Patient is unsure  History of abnormal Pap:  No MMG:  04/25/2018 Birads 1 negative BMD:   04/30/2018 Osteoporosis, T score -2.9 in her spine and -2.1 in her hip Colonoscopy: 2016 WNL, repeat in 10 years per patient TDaP:  2010, wants one today Gardasil: N/A   reports that she has never smoked. She has never used smokeless tobacco. She reports that she does not drink alcohol or use drugs. 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 All married, has 4 living grandchildren. Lost 23 week twins after a short life. Grandson died last year of hypoplastic left heart. All live within 1.5 hours.   Past Medical History:  Diagnosis Date  . Kidney stones   . Osteoporosis   . OVARIAN CA dx' d7/2013  . Ovarian mass     Past Surgical History:  Procedure Laterality Date  . ABDOMINAL HYSTERECTOMY  09/20/2011   Procedure: HYSTERECTOMY ABDOMINAL;  Surgeon: Janie Morning, MD PHD;  Location: [facility];  Service: Gynecology;  Laterality: N/A;  Total  . APPENDECTOMY    . BRAIN SURGERY  1996   Due to brain injury  . CESAREAN SECTION  1995  . LAPAROTOMY  09/20/2011   Procedure: EXPLORATORY LAPAROTOMY;  Surgeon: Janie Morning, MD PHD;  Location: [facility];  Service: Gynecology;  Laterality: N/A;  . SALPINGOOPHORECTOMY  09/20/2011   Procedure: SALPINGO OOPHERECTOMY;  Surgeon: Janie Morning, MD PHD;  Location: [facility];  Service:  Gynecology;  Laterality: Bilateral;    Current Outpatient Medications  Medication Sig Dispense Refill  . alendronate (FOSAMAX) 70 MG tablet Take 1 tablet (70 mg total) by mouth every 7 (seven) days. Take with a full glass of water on an empty stomach. 12 tablet 3  . Bioflavonoid Products (ESTER-C) 500-550 MG TABS Take 1 tablet by mouth daily.    . Calcium Carbonate-Vitamin D (CALCIUM + D PO) Take 1 tablet by mouth daily.    . Cholecalciferol (VITAMIN D-3) 1000 UNITS CAPS Take 1 capsule by mouth 2 (two) times daily.    . Multiple Vitamins-Minerals (MULTIVITAMIN WITH MINERALS) tablet Take 1 tablet by mouth every morning.     . Probiotic Product (PRO-BIOTIC BLEND) CAPS Take 1 tablet by mouth daily.     No current facility-administered medications for this visit.     Family History  Problem Relation Age of Onset  . Breast cancer Mother 36  . Diabetes Sister   . Breast cancer Maternal Aunt        diagnosed over 61; had 5 breast primaries  . Breast cancer Maternal Aunt        over age 28; maternal half sister  . Heart disease Father   . Diabetes Father     Review of Systems  Constitutional: Negative.   HENT: Negative.   Eyes: Negative.  Respiratory: Negative.   Cardiovascular: Negative.   Gastrointestinal: Negative.   Endocrine: Negative.   Genitourinary: Negative.   Musculoskeletal: Negative.   Skin: Negative.   Allergic/Immunologic: Negative.   Neurological: Negative.   Hematological: Negative.   Psychiatric/Behavioral: Negative.     Exam:   BP 118/76 (BP Location: Right Arm, Patient Position: Sitting, Cuff Size: Normal)   Pulse 68   Temp (!) 97.2 F (36.2 C) (Skin)   Ht 5' 5.75" (1.67 m)   Wt 185 lb 12.8 oz (84.3 kg)   BMI 30.22 kg/m   Weight change: @WEIGHTCHANGE @ Height:   Height: 5' 5.75" (167 cm)  Ht Readings from Last 3 Encounters:  05/01/19 5' 5.75" (1.67 m)  05/08/18 5' 5.75" (1.67 m)  04/24/18 5' 5.75" (1.67 m)    General appearance: alert, cooperative  and appears stated age Head: Normocephalic, without obvious abnormality, atraumatic Neck: no adenopathy, supple, symmetrical, trachea midline and thyroid normal to inspection and palpation Lungs: clear to auscultation bilaterally Cardiovascular: regular rate and rhythm Breasts: normal appearance, no masses or tenderness Abdomen: soft, non-tender; non distended,  no masses,  no organomegaly Extremities: extremities normal, atraumatic, no cyanosis or edema Skin: Skin color, texture, turgor normal. No rashes or lesions Lymph nodes: Cervical, supraclavicular, and axillary nodes normal. No abnormal inguinal nodes palpated Neurologic: Grossly normal   Pelvic: External genitalia:  no lesions              Urethra:  normal appearing urethra with no masses, tenderness or lesions              Bartholins and Skenes: normal                 Vagina: atrophic appearing vagina with normal color and discharge, no lesions              Cervix: absent               Bimanual Exam:  Uterus:  uterus absent              Adnexa: no mass, fullness, tenderness               Rectovaginal: Confirms               Anus:  normal sphincter tone, no lesions  Chaperone was present for exam.  A:  Well Woman with normal exam  H/O ovarian cancer, cancer free for 7 years  Osteoporosis, on Fosamax  FH of breast cancer  FH of DM  P:   No pap  Will reach out to Genetics about her risk of breast cancer and need for MRI's  3D mammogram tomorrow  Colonoscopy UTD  Discussed breast self exam  Discussed calcium and vit D intake  Screening labs, vit D  Continue fosamax

## 2019-05-01 ENCOUNTER — Ambulatory Visit (INDEPENDENT_AMBULATORY_CARE_PROVIDER_SITE_OTHER): Payer: BC Managed Care – PPO | Admitting: Obstetrics and Gynecology

## 2019-05-01 ENCOUNTER — Other Ambulatory Visit (HOSPITAL_BASED_OUTPATIENT_CLINIC_OR_DEPARTMENT_OTHER): Payer: Self-pay | Admitting: Obstetrics and Gynecology

## 2019-05-01 ENCOUNTER — Encounter: Payer: Self-pay | Admitting: Obstetrics and Gynecology

## 2019-05-01 ENCOUNTER — Other Ambulatory Visit: Payer: Self-pay | Admitting: Obstetrics and Gynecology

## 2019-05-01 VITALS — BP 118/76 | HR 68 | Temp 97.2°F | Ht 65.75 in | Wt 185.8 lb

## 2019-05-01 DIAGNOSIS — M810 Age-related osteoporosis without current pathological fracture: Secondary | ICD-10-CM | POA: Diagnosis not present

## 2019-05-01 DIAGNOSIS — Z833 Family history of diabetes mellitus: Secondary | ICD-10-CM

## 2019-05-01 DIAGNOSIS — Z0000 Encounter for general adult medical examination without abnormal findings: Secondary | ICD-10-CM | POA: Diagnosis not present

## 2019-05-01 DIAGNOSIS — Z1231 Encounter for screening mammogram for malignant neoplasm of breast: Secondary | ICD-10-CM

## 2019-05-01 DIAGNOSIS — Z23 Encounter for immunization: Secondary | ICD-10-CM

## 2019-05-01 DIAGNOSIS — Z01419 Encounter for gynecological examination (general) (routine) without abnormal findings: Secondary | ICD-10-CM | POA: Diagnosis not present

## 2019-05-01 DIAGNOSIS — Z8543 Personal history of malignant neoplasm of ovary: Secondary | ICD-10-CM | POA: Insufficient documentation

## 2019-05-01 MED ORDER — ALENDRONATE SODIUM 70 MG PO TABS: 70.0000 mg | ORAL_TABLET | ORAL | 3 refills | Status: DC

## 2019-05-01 NOTE — Patient Instructions (Signed)

## 2019-05-02 ENCOUNTER — Other Ambulatory Visit: Payer: Self-pay

## 2019-05-02 ENCOUNTER — Ambulatory Visit (HOSPITAL_BASED_OUTPATIENT_CLINIC_OR_DEPARTMENT_OTHER)
Admission: RE | Admit: 2019-05-02 | Discharge: 2019-05-02 | Disposition: A | Discharge status: Home / Self Care | Payer: BC Managed Care – PPO | Source: Ambulatory Visit | Attending: Obstetrics and Gynecology | Admitting: Obstetrics and Gynecology

## 2019-05-02 DIAGNOSIS — Z1231 Encounter for screening mammogram for malignant neoplasm of breast: Secondary | ICD-10-CM | POA: Insufficient documentation

## 2019-05-02 LAB — COMPREHENSIVE METABOLIC PANEL
ALT: 18 IU/L (ref 0–32)
AST: 13 IU/L (ref 0–40)
Albumin/Globulin Ratio: 2.4 — ABNORMAL HIGH (ref 1.2–2.2)
Albumin: 4.5 g/dL (ref 3.8–4.9)
Alkaline Phosphatase: 65 IU/L (ref 39–117)
BUN/Creatinine Ratio: 24 — ABNORMAL HIGH (ref 9–23)
BUN: 20 mg/dL (ref 6–24)
Bilirubin Total: 0.2 mg/dL (ref 0.0–1.2)
CO2: 26 mmol/L (ref 20–29)
Calcium: 9.6 mg/dL (ref 8.7–10.2)
Chloride: 106 mmol/L (ref 96–106)
Creatinine, Ser: 0.84 mg/dL (ref 0.57–1.00)
GFR calc Af Amer: 90 mL/min/{1.73_m2} (ref >59)
GFR calc non Af Amer: 78 mL/min/{1.73_m2} (ref >59)
Globulin, Total: 1.9 g/dL (ref 1.5–4.5)
Glucose: 82 mg/dL (ref 65–99)
Potassium: 5 mmol/L (ref 3.5–5.2)
Sodium: 144 mmol/L (ref 134–144)
Total Protein: 6.4 g/dL (ref 6.0–8.5)

## 2019-05-02 LAB — CBC
Hematocrit: 43.3 % (ref 34.0–46.6)
Hemoglobin: 14 g/dL (ref 11.1–15.9)
MCH: 30.3 pg (ref 26.6–33.0)
MCHC: 32.3 g/dL (ref 31.5–35.7)
MCV: 94 fL (ref 79–97)
Platelets: 322 10*3/uL (ref 150–450)
RBC: 4.62 x10E6/uL (ref 3.77–5.28)
RDW: 12.9 % (ref 11.7–15.4)
WBC: 6.2 10*3/uL (ref 3.4–10.8)

## 2019-05-02 LAB — HEMOGLOBIN A1C
Est. average glucose Bld gHb Est-mCnc: 111 mg/dL
Hgb A1c MFr Bld: 5.5 % (ref 4.8–5.6)

## 2019-05-02 LAB — LIPID PANEL
Chol/HDL Ratio: 3.2 ratio (ref 0.0–4.4)
Cholesterol, Total: 166 mg/dL (ref 100–199)
HDL: 52 mg/dL (ref >39)
LDL Chol Calc (NIH): 88 mg/dL (ref 0–99)
Triglycerides: 153 mg/dL — ABNORMAL HIGH (ref 0–149)
VLDL Cholesterol Cal: 26 mg/dL (ref 5–40)

## 2019-05-02 LAB — VITAMIN D 25 HYDROXY (VIT D DEFICIENCY, FRACTURES): Vit D, 25-Hydroxy: 30.2 ng/mL (ref 30.0–100.0)

## 2019-10-29 DIAGNOSIS — H01003 Unspecified blepharitis right eye, unspecified eyelid: Secondary | ICD-10-CM | POA: Diagnosis not present

## 2019-11-28 DIAGNOSIS — R3915 Urgency of urination: Secondary | ICD-10-CM | POA: Diagnosis not present

## 2020-04-17 ENCOUNTER — Other Ambulatory Visit: Payer: Self-pay | Admitting: Obstetrics and Gynecology

## 2020-04-17 DIAGNOSIS — M810 Age-related osteoporosis without current pathological fracture: Secondary | ICD-10-CM

## 2020-04-17 NOTE — Telephone Encounter (Signed)
Medication refill request: Alendronate 70mg   Last AEX:  05/01/19 Next AEX: 05/04/20 Last MMG (if hormonal medication request): 05/02/19  Neg  Refill authorized: 12/0

## 2020-04-21 ENCOUNTER — Other Ambulatory Visit: Payer: Self-pay | Admitting: Obstetrics and Gynecology

## 2020-04-21 DIAGNOSIS — M810 Age-related osteoporosis without current pathological fracture: Secondary | ICD-10-CM

## 2020-05-04 ENCOUNTER — Ambulatory Visit: Payer: BC Managed Care – PPO | Admitting: Obstetrics and Gynecology

## 2020-06-12 ENCOUNTER — Encounter: Payer: Self-pay | Admitting: Obstetrics and Gynecology

## 2020-06-12 ENCOUNTER — Telehealth: Payer: Self-pay

## 2020-06-12 ENCOUNTER — Ambulatory Visit: Payer: BC Managed Care – PPO | Admitting: Obstetrics and Gynecology

## 2020-06-12 ENCOUNTER — Other Ambulatory Visit: Payer: Self-pay

## 2020-06-12 VITALS — BP 106/70 | HR 74 | Resp 14 | Ht 66.0 in | Wt 186.1 lb

## 2020-06-12 DIAGNOSIS — M810 Age-related osteoporosis without current pathological fracture: Secondary | ICD-10-CM | POA: Diagnosis not present

## 2020-06-12 DIAGNOSIS — Z0000 Encounter for general adult medical examination without abnormal findings: Secondary | ICD-10-CM

## 2020-06-12 DIAGNOSIS — Z8543 Personal history of malignant neoplasm of ovary: Secondary | ICD-10-CM

## 2020-06-12 DIAGNOSIS — Z803 Family history of malignant neoplasm of breast: Secondary | ICD-10-CM

## 2020-06-12 DIAGNOSIS — Z833 Family history of diabetes mellitus: Secondary | ICD-10-CM

## 2020-06-12 DIAGNOSIS — Z9189 Other specified personal risk factors, not elsewhere classified: Secondary | ICD-10-CM

## 2020-06-12 DIAGNOSIS — Z01419 Encounter for gynecological examination (general) (routine) without abnormal findings: Secondary | ICD-10-CM

## 2020-06-12 MED ORDER — ALENDRONATE SODIUM 70 MG PO TABS: ORAL_TABLET | ORAL | 3 refills | Status: DC

## 2020-06-12 NOTE — Patient Instructions (Signed)

## 2020-06-12 NOTE — Telephone Encounter (Signed)
-----   Message from Salvadore Dom, MD sent at 06/12/2020 11:03 AM EST ----- Please set this patient up for an abbreviated breast MRI in 7/22, her risk of breast cancer is between 16-17%. Thanks, Sharee Pimple

## 2020-06-12 NOTE — Telephone Encounter (Signed)
Limited/abbreviated breast MRI orders placed for 01/2021.   Call placed to pt. Pt advised on placed limited breast MRI order and will receive call from [facility] Pt agreeable and verbalized understanding. Pt states has not called yet to schedule screening MMG.  Routing to Dr Talbert Nan for update Encounter closed  Cc: Hayley for precert

## 2020-06-12 NOTE — Progress Notes (Signed)
59 y.o. [id number] Married White or Caucasian Not Hispanic or Latino female here for annual exam.  Sexually active, no pain. No bowel or bladder issues.   H/O stage 1A ovarian cancer at 80, s/p TAH/BSO/Omentectomy/LND. S/p 3 rounds of chemo.   She is BRCA negative. Positive for HOXB13 VUS, may be linked to prostate cancer.   H/O osteoporosis, started on Fosamax 2 years ago, ran out ~3 weeks ago. Tolerating it fine.     No LMP recorded. Patient has had a hysterectomy.          Sexually active: Yes.    The current method of family planning is status post hysterectomy.    Exercising: Yes.    walking Smoker:  no  Health Maintenance: Pap:  Unsure, but prior to hysterectomy   History of abnormal Pap:  no MMG:  05-02-2019 density B/BIRADS 1 negative  BMD:   04-30-18 osteoporosis  Colonoscopy: 2016 normal, repeat in 10 years  TDaP: 05-01-2019  Gardasil: n/a   reports that she has never smoked. She has never used smokeless tobacco. She reports that she does not drink alcohol and does not use drugs. 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 All married, has 5 living grandchildren and one on the way. Lost 23 week twins after a short life. Grandson died of hypoplastic left heart. All live within 1.5 hours.  Past Medical History:  Diagnosis Date  . Kidney stones   . Osteoporosis   . OVARIAN CA dx' d7/2013  . Ovarian mass     Past Surgical History:  Procedure Laterality Date  . ABDOMINAL HYSTERECTOMY  09/20/2011   Procedure: HYSTERECTOMY ABDOMINAL;  Surgeon: Janie Morning, MD PHD;  Location: [facility];  Service: Gynecology;  Laterality: N/A;  Total  . APPENDECTOMY    . BRAIN SURGERY  1996   Due to brain injury  . CESAREAN SECTION  1995  . LAPAROTOMY  09/20/2011   Procedure: EXPLORATORY LAPAROTOMY;  Surgeon: Janie Morning, MD PHD;  Location: [facility];  Service: Gynecology;  Laterality: N/A;  . SALPINGOOPHORECTOMY  09/20/2011   Procedure: SALPINGO OOPHERECTOMY;  Surgeon: Janie Morning, MD PHD;  Location: [facility];  Service: Gynecology;  Laterality: Bilateral;    Current Outpatient Medications  Medication Sig Dispense Refill  . alendronate (FOSAMAX) 70 MG tablet TAKE 1 TABLET BY MOUTH EVERY 7 (SEVEN) DAYS. TAKE WITH A FULL GLASS OF WATER ON AN EMPTY STOMACH. 12 tablet 0  . Bioflavonoid Products (ESTER-C) 500-550 MG TABS Take 1 tablet by mouth daily.    . Calcium Carbonate-Vitamin D (CALCIUM + D PO) Take 1 tablet by mouth daily.    . Cholecalciferol (VITAMIN D-3) 1000 UNITS CAPS Take 1 capsule by mouth 2 (two) times daily.    . Multiple Vitamins-Minerals (MULTIVITAMIN WITH MINERALS) tablet Take 1 tablet by mouth every morning.     . Probiotic Product (PRO-BIOTIC BLEND) CAPS Take 1 tablet by mouth daily.     No current facility-administered medications for this visit.    Family History  Problem Relation Age of Onset  . Breast cancer Mother 97  . Diabetes Sister   . Breast cancer Maternal Aunt        diagnosed over 34; had 5 breast primaries  . Breast cancer Maternal Aunt        over age 48; maternal half sister  . Heart disease Father   . Diabetes Father     Review of Systems  All other systems reviewed and are negative.  Exam:   BP 106/70 (BP Location: Right Arm, Patient Position: Sitting, Cuff Size: Large)   Pulse 74   Resp 14   Ht 5' 6"  (1.676 m)   Wt 186 lb 1.6 oz (84.4 kg)   BMI 30.04 kg/m   Weight change: @WEIGHTCHANGE @ Height:   Height: 5' 6"  (167.6 cm)  Ht Readings from Last 3 Encounters:  06/12/20 5' 6"  (1.676 m)  05/01/19 5' 5.75" (1.67 m)  05/08/18 5' 5.75" (1.67 m)    General appearance: alert, cooperative and appears stated age Head: Normocephalic, without obvious abnormality, atraumatic Neck: no adenopathy, supple, symmetrical, trachea midline and thyroid normal to inspection and palpation Lungs: clear to auscultation bilaterally Cardiovascular: regular rate and rhythm Breasts: normal appearance, no masses or  tenderness Abdomen: soft, non-tender; non distended,  no masses,  no organomegaly Extremities: extremities normal, atraumatic, no cyanosis or edema Skin: Skin color, texture, turgor normal. No rashes or lesions Lymph nodes: Cervical, supraclavicular, and axillary nodes normal. No abnormal inguinal nodes palpated Neurologic: Grossly normal   Pelvic: External genitalia:  no lesions              Urethra:  normal appearing urethra with no masses, tenderness or lesions              Bartholins and Skenes: normal                 Vagina: atrophic appearing vagina with normal color and discharge, no lesions              Cervix: absent               Bimanual Exam:  Uterus:  uterus absent              Adnexa: no mass, fullness, tenderness               Rectovaginal: Confirms               Anus:  normal sphincter tone, no lesions  Karmen Bongo chaperoned for the exam.  A:  Well Woman with normal exam  H/O Stage IA ovarian cancer  Osteoporosis  FH DM   P:   Due for her mammogram, she will schedule  Needs breast MRI 6 months later   DEXA ordered  Screening labs (fasting), CA 125, HgbA1C, vit d level  Discussed breast self exam  Discussed calcium and vit D intake

## 2020-06-13 LAB — CBC
Hematocrit: 44.6 % (ref 34.0–46.6)
Hemoglobin: 14.7 g/dL (ref 11.1–15.9)
MCH: 30.5 pg (ref 26.6–33.0)
MCHC: 33 g/dL (ref 31.5–35.7)
MCV: 93 fL (ref 79–97)
Platelets: 369 10*3/uL (ref 150–450)
RBC: 4.82 x10E6/uL (ref 3.77–5.28)
RDW: 12.8 % (ref 11.7–15.4)
WBC: 7 10*3/uL (ref 3.4–10.8)

## 2020-06-13 LAB — COMPREHENSIVE METABOLIC PANEL
ALT: 18 IU/L (ref 0–32)
AST: 12 IU/L (ref 0–40)
Albumin/Globulin Ratio: 2 (ref 1.2–2.2)
Albumin: 4.5 g/dL (ref 3.8–4.9)
Alkaline Phosphatase: 67 IU/L (ref 44–121)
BUN/Creatinine Ratio: 27 — ABNORMAL HIGH (ref 9–23)
BUN: 21 mg/dL (ref 6–24)
Bilirubin Total: 0.5 mg/dL (ref 0.0–1.2)
CO2: 26 mmol/L (ref 20–29)
Calcium: 9.8 mg/dL (ref 8.7–10.2)
Chloride: 104 mmol/L (ref 96–106)
Creatinine, Ser: 0.79 mg/dL (ref 0.57–1.00)
GFR calc Af Amer: 96 mL/min/{1.73_m2} (ref >59)
GFR calc non Af Amer: 83 mL/min/{1.73_m2} (ref >59)
Globulin, Total: 2.3 g/dL (ref 1.5–4.5)
Glucose: 86 mg/dL (ref 65–99)
Potassium: 4.6 mmol/L (ref 3.5–5.2)
Sodium: 141 mmol/L (ref 134–144)
Total Protein: 6.8 g/dL (ref 6.0–8.5)

## 2020-06-13 LAB — CA 125: Cancer Antigen (CA) 125: 7.6 U/mL (ref 0.0–38.1)

## 2020-06-13 LAB — VITAMIN D 25 HYDROXY (VIT D DEFICIENCY, FRACTURES): Vit D, 25-Hydroxy: 31.6 ng/mL (ref 30.0–100.0)

## 2020-06-13 LAB — LIPID PANEL
Chol/HDL Ratio: 3.3 ratio (ref 0.0–4.4)
Cholesterol, Total: 182 mg/dL (ref 100–199)
HDL: 56 mg/dL (ref >39)
LDL Chol Calc (NIH): 107 mg/dL — ABNORMAL HIGH (ref 0–99)
Triglycerides: 106 mg/dL (ref 0–149)
VLDL Cholesterol Cal: 19 mg/dL (ref 5–40)

## 2020-06-13 LAB — HEMOGLOBIN A1C
Est. average glucose Bld gHb Est-mCnc: 117 mg/dL
Hgb A1c MFr Bld: 5.7 % — ABNORMAL HIGH (ref 4.8–5.6)

## 2020-06-16 DIAGNOSIS — H00011 Hordeolum externum right upper eyelid: Secondary | ICD-10-CM | POA: Diagnosis not present

## 2020-06-24 ENCOUNTER — Encounter (HOSPITAL_COMMUNITY): Payer: BC Managed Care – PPO

## 2020-07-07 ENCOUNTER — Encounter: Payer: Self-pay | Admitting: Obstetrics and Gynecology

## 2020-07-08 ENCOUNTER — Other Ambulatory Visit: Payer: Self-pay

## 2020-07-08 ENCOUNTER — Ambulatory Visit (HOSPITAL_COMMUNITY)
Admission: RE | Admit: 2020-07-08 | Discharge: 2020-07-08 | Disposition: A | Discharge status: Home / Self Care | Payer: Managed Care, Other (non HMO) | Source: Ambulatory Visit | Attending: Obstetrics and Gynecology | Admitting: Obstetrics and Gynecology

## 2020-07-08 ENCOUNTER — Other Ambulatory Visit (HOSPITAL_COMMUNITY): Payer: Self-pay | Admitting: Obstetrics and Gynecology

## 2020-07-08 DIAGNOSIS — M85851 Other specified disorders of bone density and structure, right thigh: Secondary | ICD-10-CM | POA: Diagnosis not present

## 2020-07-08 DIAGNOSIS — M810 Age-related osteoporosis without current pathological fracture: Secondary | ICD-10-CM

## 2020-07-08 DIAGNOSIS — Z1231 Encounter for screening mammogram for malignant neoplasm of breast: Secondary | ICD-10-CM | POA: Diagnosis present

## 2020-07-08 DIAGNOSIS — Z780 Asymptomatic menopausal state: Secondary | ICD-10-CM | POA: Diagnosis not present

## 2020-07-08 DIAGNOSIS — R29890 Loss of height: Secondary | ICD-10-CM | POA: Diagnosis not present

## 2020-10-26 ENCOUNTER — Ambulatory Visit: Payer: BC Managed Care – PPO | Admitting: Obstetrics and Gynecology

## 2021-01-05 ENCOUNTER — Ambulatory Visit: Payer: Managed Care, Other (non HMO)

## 2021-01-14 ENCOUNTER — Ambulatory Visit: Payer: Managed Care, Other (non HMO)

## 2021-01-20 ENCOUNTER — Other Ambulatory Visit: Payer: Self-pay

## 2021-01-20 ENCOUNTER — Ambulatory Visit
Admission: RE | Admit: 2021-01-20 | Discharge: 2021-01-20 | Disposition: A | Discharge status: Home / Self Care | Payer: Managed Care, Other (non HMO) | Source: Ambulatory Visit | Attending: Obstetrics and Gynecology | Admitting: Obstetrics and Gynecology

## 2021-01-20 DIAGNOSIS — Z9189 Other specified personal risk factors, not elsewhere classified: Secondary | ICD-10-CM

## 2021-01-20 DIAGNOSIS — Z803 Family history of malignant neoplasm of breast: Secondary | ICD-10-CM

## 2021-01-20 DIAGNOSIS — Z8543 Personal history of malignant neoplasm of ovary: Secondary | ICD-10-CM

## 2021-01-20 MED ORDER — GADOBUTROL 1 MMOL/ML IV SOLN
9.0000 mL | Freq: Once | INTRAVENOUS | Status: AC | PRN
  Administered 2021-01-20: 9 mL via INTRAVENOUS

## 2021-03-23 ENCOUNTER — Encounter: Payer: Self-pay | Admitting: Podiatry

## 2021-03-23 ENCOUNTER — Other Ambulatory Visit: Payer: Self-pay

## 2021-03-23 ENCOUNTER — Ambulatory Visit (INDEPENDENT_AMBULATORY_CARE_PROVIDER_SITE_OTHER): Payer: Managed Care, Other (non HMO) | Admitting: Podiatry

## 2021-03-23 ENCOUNTER — Ambulatory Visit (INDEPENDENT_AMBULATORY_CARE_PROVIDER_SITE_OTHER): Payer: Managed Care, Other (non HMO)

## 2021-03-23 DIAGNOSIS — M79671 Pain in right foot: Secondary | ICD-10-CM

## 2021-03-23 DIAGNOSIS — M722 Plantar fascial fibromatosis: Secondary | ICD-10-CM

## 2021-03-23 DIAGNOSIS — M7731 Calcaneal spur, right foot: Secondary | ICD-10-CM | POA: Diagnosis not present

## 2021-03-23 MED ORDER — MELOXICAM 15 MG PO TABS: 15.0000 mg | ORAL_TABLET | Freq: Every day | ORAL | 0 refills | Status: DC | PRN

## 2021-03-23 NOTE — Patient Instructions (Signed)
For instructions on how to put on your Night Splint, please visit [email or web address]  Plantar Fasciitis (Heel Spur Syndrome) with Rehab The plantar fascia is a fibrous, ligament-like, soft-tissue structure that spans the bottom of the foot. Plantar fasciitis is a condition that causes pain in the foot due to inflammation of the tissue. SYMPTOMS  Pain and tenderness on the underneath side of the foot. Pain that worsens with standing or walking. CAUSES  Plantar fasciitis is caused by irritation and injury to the plantar fascia on the underneath side of the foot. Common mechanisms of injury include: Direct trauma to bottom of the foot. Damage to a small nerve that runs under the foot where the main fascia attaches to the heel bone. Stress placed on the plantar fascia due to bone spurs. RISK INCREASES WITH:  Activities that place stress on the plantar fascia (running, jumping, pivoting, or cutting). Poor strength and flexibility. Improperly fitted shoes. Tight calf muscles. Flat feet. Failure to warm-up properly before activity. Obesity. PREVENTION Warm up and stretch properly before activity. Allow for adequate recovery between workouts. Maintain physical fitness: Strength, flexibility, and endurance. Cardiovascular fitness. Maintain a health body weight. Avoid stress on the plantar fascia. Wear properly fitted shoes, including arch supports for individuals who have flat feet.  PROGNOSIS  If treated properly, then the symptoms of plantar fasciitis usually resolve without surgery. However, occasionally surgery is necessary.  RELATED COMPLICATIONS  Recurrent symptoms that may result in a chronic condition. Problems of the lower back that are caused by compensating for the injury, such as limping. Pain or weakness of the foot during push-off following surgery. Chronic inflammation, scarring, and partial or complete fascia tear, occurring more often from repeated  injections.  TREATMENT  Treatment initially involves the use of ice and medication to help reduce pain and inflammation. The use of strengthening and stretching exercises may help reduce pain with activity, especially stretches of the Achilles tendon. These exercises may be performed at home or with a therapist. Your caregiver may recommend that you use heel cups of arch supports to help reduce stress on the plantar fascia. Occasionally, corticosteroid injections are given to reduce inflammation. If symptoms persist for greater than 6 months despite non-surgical (conservative), then surgery may be recommended.   MEDICATION  If pain medication is necessary, then nonsteroidal anti-inflammatory medications, such as aspirin and ibuprofen, or other minor pain relievers, such as acetaminophen, are often recommended. Do not take pain medication within 7 days before surgery. Prescription pain relievers may be given if deemed necessary by your caregiver. Use only as directed and only as much as you need. Corticosteroid injections may be given by your caregiver. These injections should be reserved for the most serious cases, because they may only be given a certain number of times.  HEAT AND COLD Cold treatment (icing) relieves pain and reduces inflammation. Cold treatment should be applied for 10 to 15 minutes every 2 to 3 hours for inflammation and pain and immediately after any activity that aggravates your symptoms. Use ice packs or massage the area with a piece of ice (ice massage). Heat treatment may be used prior to performing the stretching and strengthening activities prescribed by your caregiver, physical therapist, or athletic trainer. Use a heat pack or soak the injury in warm water.  SEEK IMMEDIATE MEDICAL CARE IF: Treatment seems to offer no benefit, or the condition worsens. Any medications produce adverse side effects.  EXERCISES- RANGE OF MOTION (ROM) AND STRETCHING EXERCISES - Plantar  Fasciitis (Heel  Spur Syndrome) These exercises may help you when beginning to rehabilitate your injury. Your symptoms may resolve with or without further involvement from your physician, physical therapist or athletic trainer. While completing these exercises, remember:  Restoring tissue flexibility helps normal motion to return to the joints. This allows healthier, less painful movement and activity. An effective stretch should be held for at least 30 seconds. A stretch should never be painful. You should only feel a gentle lengthening or release in the stretched tissue.  RANGE OF MOTION - Toe Extension, Flexion Sit with your right / left leg crossed over your opposite knee. Grasp your toes and gently pull them back toward the top of your foot. You should feel a stretch on the bottom of your toes and/or foot. Hold this stretch for 10 seconds. Now, gently pull your toes toward the bottom of your foot. You should feel a stretch on the top of your toes and or foot. Hold this stretch for 10 seconds. Repeat  times. Complete this stretch 3 times per day.   RANGE OF MOTION - Ankle Dorsiflexion, Active Assisted Remove shoes and sit on a chair that is preferably not on a carpeted surface. Place right / left foot under knee. Extend your opposite leg for support. Keeping your heel down, slide your right / left foot back toward the chair until you feel a stretch at your ankle or calf. If you do not feel a stretch, slide your bottom forward to the edge of the chair, while still keeping your heel down. Hold this stretch for 10 seconds. Repeat 3 times. Complete this stretch 2 times per day.   STRETCH  Gastroc, Standing Place hands on wall. Extend right / left leg, keeping the front knee somewhat bent. Slightly point your toes inward on your back foot. Keeping your right / left heel on the floor and your knee straight, shift your weight toward the wall, not allowing your back to arch. You should feel a  gentle stretch in the right / left calf. Hold this position for 10 seconds. Repeat 3 times. Complete this stretch 2 times per day.  STRETCH  Soleus, Standing Place hands on wall. Extend right / left leg, keeping the other knee somewhat bent. Slightly point your toes inward on your back foot. Keep your right / left heel on the floor, bend your back knee, and slightly shift your weight over the back leg so that you feel a gentle stretch deep in your back calf. Hold this position for 10 seconds. Repeat 3 times. Complete this stretch 2 times per day.  STRETCH  Gastrocsoleus, Standing  Note: This exercise can place a lot of stress on your foot and ankle. Please complete this exercise only if specifically instructed by your caregiver.  Place the ball of your right / left foot on a step, keeping your other foot firmly on the same step. Hold on to the wall or a rail for balance. Slowly lift your other foot, allowing your body weight to press your heel down over the edge of the step. You should feel a stretch in your right / left calf. Hold this position for 10 seconds. Repeat this exercise with a slight bend in your right / left knee. Repeat 3 times. Complete this stretch 2 times per day.   STRENGTHENING EXERCISES - Plantar Fasciitis (Heel Spur Syndrome)  These exercises may help you when beginning to rehabilitate your injury. They may resolve your symptoms with or without further involvement from your  physician, physical therapist or athletic trainer. While completing these exercises, remember:  Muscles can gain both the endurance and the strength needed for everyday activities through controlled exercises. Complete these exercises as instructed by your physician, physical therapist or athletic trainer. Progress the resistance and repetitions only as guided.  STRENGTH - Towel Curls Sit in a chair positioned on a non-carpeted surface. Place your foot on a towel, keeping your heel on the  floor. Pull the towel toward your heel by only curling your toes. Keep your heel on the floor. Repeat 3 times. Complete this exercise 2 times per day.  STRENGTH - Ankle Inversion Secure one end of a rubber exercise band/tubing to a fixed object (table, pole). Loop the other end around your foot just before your toes. Place your fists between your knees. This will focus your strengthening at your ankle. Slowly, pull your big toe up and in, making sure the band/tubing is positioned to resist the entire motion. Hold this position for 10 seconds. Have your muscles resist the band/tubing as it slowly pulls your foot back to the starting position. Repeat 3 times. Complete this exercises 2 times per day.  Document Released: 06/20/2005 Document Revised: 09/12/2011 Document Reviewed: 10/02/2008 [facility] Patient Information 2014 [place], [place].  Meloxicam Tablets What is this medication? MELOXICAM (mel OX i cam) treats mild to moderate pain, inflammation, or arthritis. It works by decreasing inflammation. It belongs to a group of medications called NSAIDs. This medicine may be used for other purposes; ask your health care provider or pharmacist if you have questions. COMMON BRAND NAME(S): Mobic What should I tell my care team before I take this medication? They need to know if you have any of these conditions: Asthma (lung or breathing disease) Bleeding disorder Coronary artery bypass graft (CABG) within the past 2 weeks Dehydration Heart attack Heart disease Heart failure High blood pressure If you often drink alcohol Kidney disease Liver disease Smoke tobacco cigarettes Stomach bleeding Stomach ulcers, other stomach or intestine problems Take medications that treat or prevent blood clots Taking other steroids like dexamethasone or prednisone An unusual or allergic reaction to meloxicam, other medications, foods, dyes, or preservatives Pregnant or trying to get  pregnant Breast-feeding How should I use this medication? Take this medication by mouth. Take it as directed on the prescription label at the same time every day. You can take it with or without food. If it upsets your stomach, take it with food. Do not use it more often than directed. There may be unused or extra doses in the bottle after you finish your treatment. Talk to your care team if you have questions about your dose. A special MedGuide will be given to you by the pharmacist with each prescription and refill. Be sure to read this information carefully each time. Talk to your care team about the use of this medication in children. Special care may be needed. Patients over 41 years of age may have a stronger reaction and need a smaller dose. Overdosage: If you think you have taken too much of this medicine contact a poison control center or emergency room at once. NOTE: This medicine is only for you. Do not share this medicine with others. What if I miss a dose? If you miss a dose, take it as soon as you can. If it is almost time for your next dose, take only that dose. Do not take double or extra doses. What may interact with this medication? Do not take this medication  with any of the following: Cidofovir Ketorolac This medication may also interact with the following: Aspirin and aspirin-like medications Certain medications for blood pressure, heart disease, irregular heart beat Certain medications for depression, anxiety, or psychotic disturbances Certain medications that treat or prevent blood clots like warfarin, enoxaparin, dalteparin, apixaban, dabigatran, rivaroxaban Cyclosporine Diuretics Fluconazole Lithium Methotrexate Other NSAIDs, medications for pain and inflammation, like ibuprofen and naproxen Pemetrexed This list may not describe all possible interactions. Give your health care provider a list of all the medicines, herbs, non-prescription drugs, or dietary  supplements you use. Also tell them if you smoke, drink alcohol, or use illegal drugs. Some items may interact with your medicine. What should I watch for while using this medication? Visit your care team for regular checks on your progress. Tell your care team if your symptoms do not start to get better or if they get worse. Do not take other medications that contain aspirin, ibuprofen, or naproxen with this medication. Side effects such as stomach upset, nausea, or ulcers may be more likely to occur. Many non-prescription medications contain aspirin, ibuprofen, or naproxen. Always read labels carefully. This medication can cause serious ulcers and bleeding in the stomach. It can happen with no warning. Smoking, drinking alcohol, older age, and poor health can also increase risks. Call your care team right away if you have stomach pain or blood in your vomit or stool. This medication does not prevent a heart attack or stroke. This medication may increase the chance of a heart attack or stroke. The chance may increase the longer you use this medication or if you have heart disease. If you take aspirin to prevent a heart attack or stroke, talk to your care team about using this medication. Alcohol may interfere with the effect of this medication. Avoid alcoholic drinks. This medication may cause serious skin reactions. They can happen weeks to months after starting the medication. Contact your care team right away if you notice fevers or flu-like symptoms with a rash. The rash may be red or purple and then turn into blisters or peeling of the skin. Or, you might notice a red rash with swelling of the face, lips or lymph nodes in your neck or under your arms. Talk to your care team if you are pregnant before taking this medication. Taking this medication between weeks 20 and 30 of pregnancy may harm your unborn baby. Your care team will monitor you closely if you need to take it. After 30 weeks of pregnancy,  do not take this medication. You may get drowsy or dizzy. Do not drive, use machinery, or do anything that needs mental alertness until you know how this medication affects you. Do not stand up or sit up quickly, especially if you are an older patient. This reduces the risk of dizzy or fainting spells. Be careful brushing or flossing your teeth or using a toothpick because you may get an infection or bleed more easily. If you have any dental work done, tell your dentist you are receiving this medication. This medication may make it more difficult to get pregnant. Talk to your care team if you are concerned about your fertility. What side effects may I notice from receiving this medication? Side effects that you should report to your care team as soon as possible: Allergic reactions-skin rash, itching, hives, swelling of the face, lips, tongue, or throat Bleeding-bloody or black, tar-like stools, vomiting blood or brown material that looks like coffee grounds, red or dark brown  urine, small red or purple spots on skin, unusual bruising or bleeding Heart attack-pain or tightness in the chest, shoulders, arms, or jaw, nausea, shortness of breath, cold or clammy skin, feeling faint or lightheaded Heart failure-shortness of breath, swelling of ankles, feet, or hands, sudden weight gain, unusual weakness or fatigue Increase in blood pressure Kidney injury-decrease in the amount of urine, swelling of the ankles, hands, or feet Liver injury-right upper belly pain, loss of appetite, nausea, light-colored stool, dark yellow or brown urine, yellowing skin or eyes, unusual weakness or fatigue Rash, fever, and swollen lymph nodes Redness, blistering, peeling, or loosening of the skin, including inside the mouth Stroke-sudden numbness or weakness of the face, arm, or leg, trouble speaking, confusion, trouble walking, loss of balance or coordination, dizziness, severe headache, change in vision Side effects that  usually do not require medical attention (report to your care team if they continue or are bothersome): Diarrhea Nausea Upset stomach This list may not describe all possible side effects. Call your doctor for medical advice about side effects. You may report side effects to FDA at 1-800-FDA-1088. Where should I keep my medication? Keep out of the reach of children and pets. Store at room temperature between 20 and 25 degrees C (68 and 77 degrees F). Protect from moisture. Keep the container tightly closed. Get rid of any unused medication after the expiration date. To get rid of medications that are no longer needed or have expired: Take the medication to a medication take-back program. Check with your pharmacy or law enforcement to find a location. If you cannot return the medication, check the label or package insert to see if the medication should be thrown out in the garbage or flushed down the toilet. If you are not sure, ask your care team. If it is safe to put it in the trash, empty the medication out of the container. Mix the medication with cat litter, dirt, coffee grounds, or other unwanted substance. Seal the mixture in a bag or container. Put it in the trash. NOTE: This sheet is a summary. It may not cover all possible information. If you have questions about this medicine, talk to your doctor, pharmacist, or health care provider.  2022 Elsevier/Gold Standard (2020-08-13 11:53:22)

## 2021-03-29 NOTE — Progress Notes (Signed)
Subjective:   Patient ID: Jordan Golden, female   DOB: 60 y.o.   MRN: [id number]   HPI 60 year old female presents the office today for concerns of right heel pain which has been ongoing for about 3 weeks.  Hurts in the morning.  Feels okay during the day but hurts more at nighttime.  She is tried over-the-counter NSAIDs which helps somewhat.  No recent injury.  No other concerns.   Review of Systems  All other systems reviewed and are negative.  Past Medical History:  Diagnosis Date   Kidney stones    Osteoporosis    OVARIAN CA dx' d7/2013   Ovarian mass     Past Surgical History:  Procedure Laterality Date   ABDOMINAL HYSTERECTOMY  09/20/2011   Procedure: HYSTERECTOMY ABDOMINAL;  Surgeon: Janie Morning, MD PHD;  Location: [facility];  Service: Gynecology;  Laterality: N/A;  Total   APPENDECTOMY     BRAIN SURGERY  1996   Due to brain injury   [place]  09/20/2011   Procedure: EXPLORATORY LAPAROTOMY;  Surgeon: Janie Morning, MD PHD;  Location: [facility];  Service: Gynecology;  Laterality: N/A;   SALPINGOOPHORECTOMY  09/20/2011   Procedure: SALPINGO OOPHERECTOMY;  Surgeon: Janie Morning, MD PHD;  Location: [facility];  Service: Gynecology;  Laterality: Bilateral;     Current Outpatient Medications:    meloxicam (MOBIC) 15 MG tablet, Take 1 tablet (15 mg total) by mouth daily as needed for pain., Disp: 30 tablet, Rfl: 0   alendronate (FOSAMAX) 70 MG tablet, TAKE 1 TABLET BY MOUTH EVERY 7 (SEVEN) DAYS. TAKE WITH A FULL GLASS OF WATER ON AN EMPTY STOMACH., Disp: 12 tablet, Rfl: 3   Bioflavonoid Products (ESTER-C) 500-550 MG TABS, Take 1 tablet by mouth daily., Disp: , Rfl:    Calcium Carbonate-Vitamin D (CALCIUM + D PO), Take 1 tablet by mouth daily., Disp: , Rfl:    cephALEXin (KEFLEX) 500 MG capsule, Take 500 mg by mouth 3 (three) times daily., Disp: , Rfl:    Cholecalciferol (VITAMIN D-3) 1000 UNITS CAPS, Take 1 capsule by mouth 2 (two) times daily., Disp: ,  Rfl:    Multiple Vitamins-Minerals (MULTIVITAMIN WITH MINERALS) tablet, Take 1 tablet by mouth every morning. , Disp: , Rfl:    Probiotic Product (PRO-BIOTIC BLEND) CAPS, Take 1 tablet by mouth daily., Disp: , Rfl:   Allergies  Allergen Reactions   Tramadol    Ultram [Tramadol Hcl] Nausea And Vomiting    Passed out          Objective:  Physical Exam  General: AAO x3, NAD  Dermatological: Skin is warm, dry and supple bilateral.  There are no open sores, no preulcerative lesions, no rash or signs of infection present.  Vascular: Dorsalis Pedis artery and Posterior Tibial artery pedal pulses are 2/4 bilateral with immedate capillary fill time.  There is no pain with calf compression, swelling, warmth, erythema.   Neruologic: Grossly intact via light touch bilateral.  Negative Tinel sign.  Musculoskeletal: Tenderness to palpation along the plantar medial tubercle of the calcaneus at the insertion of plantar fascia on the right foot. There is no pain along the course of the plantar fascia within the arch of the foot. Plantar fascia appears to be intact. There is no pain with lateral compression of the calcaneus or pain with vibratory sensation. There is no pain along the course or insertion of the achilles tendon. No other areas of tenderness to bilateral lower extremities.Muscular strength  5/5 in all groups tested bilateral.  Gait: Unassisted, Nonantalgic.       Assessment:   Right heel pain, plantar fasciitis     Plan:  -Treatment options discussed including all alternatives, risks, and complications -Etiology of symptoms were discussed -X-rays were obtained and reviewed with the patient.  No evidence of acute fracture.  Inferior calcaneal spurring is evident. -Discussed steroid injection.  Prescribed meloxicam.  Night splint dispensed.  Discussed shoe modifications and arch.  Stretching, icing daily.  Trula Slade DPM

## 2021-05-14 ENCOUNTER — Other Ambulatory Visit: Payer: Self-pay | Admitting: Obstetrics and Gynecology

## 2021-05-14 DIAGNOSIS — M810 Age-related osteoporosis without current pathological fracture: Secondary | ICD-10-CM

## 2021-05-14 NOTE — Telephone Encounter (Signed)
Last annual exam was 12/21 Annual exam scheduled on 07/07/2021

## 2021-07-06 NOTE — Progress Notes (Signed)
61 y.o. [id number] Married White or Caucasian Not Hispanic or Latino female here for annual exam.     H/O stage 1A ovarian cancer at 73, s/p TAH/BSO/Omentectomy/LND. S/p 3 rounds of chemo.    She is BRCA negative. Positive for HOXB13 VUS, may be linked to prostate cancer.   No LMP recorded. Patient has had a hysterectomy.          Sexually active: Yes.    The current method of family planning is status post hysterectomy.    Exercising: Yes.    The patient does not participate in regular exercise at present. Smoker:  no  Health Maintenance: Pap:  unsure but prior to hysterectomy History of abnormal Pap:  no MMG:  01/21/21 density b Bi-rads 2 benign  BMD:   07/08/20 osteopenic t-score -2.0, previously had osteoporosis. On Fosamax in 11/19 Colonoscopy: 2016 normal f/u 10 years  TDaP:  05/01/19  Gardasil: n/a   reports that she has never smoked. She has never used smokeless tobacco. She reports that she does not drink alcohol and does not use drugs. 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 4 children, 2 girls, 2 boys. All married, has 6 living grandchildren (8 months-8 years). Lost 23 week twins after a short life. Grandson died of hypoplastic left heart. All live within 1.5 hours  Past Medical History:  Diagnosis Date   Kidney stones    Osteoporosis    OVARIAN CA dx' d7/2013   Prediabetes     Past Surgical History:  Procedure Laterality Date   ABDOMINAL HYSTERECTOMY  09/20/2011   Procedure: HYSTERECTOMY ABDOMINAL;  Surgeon: Janie Morning, MD PHD;  Location: [facility];  Service: Gynecology;  Laterality: N/A;  Total   APPENDECTOMY     BRAIN SURGERY  1996   Due to brain injury   [place]  09/20/2011   Procedure: EXPLORATORY LAPAROTOMY;  Surgeon: Janie Morning, MD PHD;  Location: [facility];  Service: Gynecology;  Laterality: N/A;   SALPINGOOPHORECTOMY  09/20/2011   Procedure: SALPINGO OOPHERECTOMY;  Surgeon: Janie Morning, MD  PHD;  Location: [facility];  Service: Gynecology;  Laterality: Bilateral;    Current Outpatient Medications  Medication Sig Dispense Refill   alendronate (FOSAMAX) 70 MG tablet TAKE 1 TABLET BY MOUTH EVERY 7 (SEVEN) DAYS. TAKE WITH A FULL GLASS OF WATER ON AN EMPTY STOMACH. 12 tablet 0   Bioflavonoid Products (ESTER-C) 500-550 MG TABS Take 1 tablet by mouth daily.     Calcium Carbonate-Vitamin D (CALCIUM + D PO) Take 1 tablet by mouth daily.     Cholecalciferol (VITAMIN D-3) 1000 UNITS CAPS Take 1 capsule by mouth 2 (two) times daily.     Multiple Vitamins-Minerals (MULTIVITAMIN WITH MINERALS) tablet Take 1 tablet by mouth every morning.      Probiotic Product (PRO-BIOTIC BLEND) CAPS Take 1 tablet by mouth daily.     No current facility-administered medications for this visit.    Family History  Problem Relation Age of Onset   Breast cancer Mother 36   Diabetes Sister    Breast cancer Maternal Aunt        diagnosed over 92; had 5 breast primaries   Breast cancer Maternal Aunt        over age 47; maternal half sister   Heart disease Father    Diabetes Father     Review of Systems  All other systems reviewed and are negative.  Exam:   BP  108/72    Pulse 61    Ht 5' 7"  (1.702 m)    Wt 180 lb 12.8 oz (82 kg)    SpO2 100%    BMI 28.32 kg/m   Weight change: @WEIGHTCHANGE @ Height:   Height: 5' 7"  (170.2 cm)  Ht Readings from Last 3 Encounters:  07/07/21 5' 7"  (1.702 m)  06/12/20 5' 6"  (1.676 m)  05/01/19 5' 5.75" (1.67 m)    General appearance: alert, cooperative and appears stated age Head: Normocephalic, without obvious abnormality, atraumatic Neck: no adenopathy, supple, symmetrical, trachea midline and thyroid normal to inspection and palpation Lungs: clear to auscultation bilaterally Cardiovascular: regular rate and rhythm Breasts: normal appearance, no masses or tenderness Abdomen: soft, non-tender; non distended,  no masses,  no organomegaly Extremities: extremities normal,  atraumatic, no cyanosis or edema Skin: Skin color, texture, turgor normal. No rashes or lesions Lymph nodes: Cervical, supraclavicular, and axillary nodes normal. No abnormal inguinal nodes palpated Neurologic: Grossly normal   Pelvic: External genitalia:  no lesions              Urethra:  normal appearing urethra with no masses, tenderness or lesions              Bartholins and Skenes: normal                 Vagina: atrophic appearing vagina with normal color and discharge, no lesions              Cervix: absent               Bimanual Exam:  Uterus:  uterus absent              Adnexa: no mass, fullness, tenderness               Rectovaginal: Confirms               Anus:  normal sphincter tone, no lesions  Gae Dry chaperoned for the exam.  1. Well woman exam Discussed breast self exam No pap needed Mammogram and colonoscopy are UTD  2. Prediabetes - Hemoglobin A1c  3. Laboratory exam ordered as part of routine general medical examination - CBC - Comprehensive metabolic panel - Lipid panel  4. Osteoporosis, unspecified osteoporosis type, unspecified pathological fracture presence -DEXA due in one year -Discussed calcium and vit D intake - alendronate (FOSAMAX) 70 MG tablet; Take with a full glass of water on an empty stomach.  Dispense: 12 tablet; Refill: 3  5. History of ovarian cancer - CA 125  6. Elevated LDL cholesterol level - Lipid panel

## 2021-07-07 ENCOUNTER — Ambulatory Visit (INDEPENDENT_AMBULATORY_CARE_PROVIDER_SITE_OTHER): Payer: Managed Care, Other (non HMO) | Admitting: Obstetrics and Gynecology

## 2021-07-07 ENCOUNTER — Other Ambulatory Visit: Payer: Self-pay

## 2021-07-07 ENCOUNTER — Encounter: Payer: Self-pay | Admitting: Obstetrics and Gynecology

## 2021-07-07 VITALS — BP 108/72 | HR 61 | Ht 67.0 in | Wt 180.8 lb

## 2021-07-07 DIAGNOSIS — E7800 Pure hypercholesterolemia, unspecified: Secondary | ICD-10-CM

## 2021-07-07 DIAGNOSIS — Z0000 Encounter for general adult medical examination without abnormal findings: Secondary | ICD-10-CM

## 2021-07-07 DIAGNOSIS — Z8249 Family history of ischemic heart disease and other diseases of the circulatory system: Secondary | ICD-10-CM | POA: Insufficient documentation

## 2021-07-07 DIAGNOSIS — Z01419 Encounter for gynecological examination (general) (routine) without abnormal findings: Secondary | ICD-10-CM

## 2021-07-07 DIAGNOSIS — R7303 Prediabetes: Secondary | ICD-10-CM | POA: Diagnosis not present

## 2021-07-07 DIAGNOSIS — M810 Age-related osteoporosis without current pathological fracture: Secondary | ICD-10-CM

## 2021-07-07 DIAGNOSIS — Z8543 Personal history of malignant neoplasm of ovary: Secondary | ICD-10-CM

## 2021-07-07 MED ORDER — ALENDRONATE SODIUM 70 MG PO TABS: ORAL_TABLET | ORAL | 3 refills | Status: DC

## 2021-07-07 NOTE — Patient Instructions (Signed)

## 2021-07-08 LAB — COMPREHENSIVE METABOLIC PANEL
AG Ratio: 2.3 (calc) (ref 1.0–2.5)
ALT: 17 U/L (ref 6–29)
AST: 16 U/L (ref 10–35)
Albumin: 4.5 g/dL (ref 3.6–5.1)
Alkaline phosphatase (APISO): 52 U/L (ref 37–153)
BUN: 19 mg/dL (ref 7–25)
CO2: 27 mmol/L (ref 20–32)
Calcium: 9.6 mg/dL (ref 8.6–10.4)
Chloride: 106 mmol/L (ref 98–110)
Creat: 0.76 mg/dL (ref 0.50–1.03)
Globulin: 2 g/dL (calc) (ref 1.9–3.7)
Glucose, Bld: 87 mg/dL (ref 65–99)
Potassium: 4.4 mmol/L (ref 3.5–5.3)
Sodium: 140 mmol/L (ref 135–146)
Total Bilirubin: 0.4 mg/dL (ref 0.2–1.2)
Total Protein: 6.5 g/dL (ref 6.1–8.1)

## 2021-07-08 LAB — CBC
HCT: 41.2 % (ref 35.0–45.0)
Hemoglobin: 13.3 g/dL (ref 11.7–15.5)
MCH: 30.4 pg (ref 27.0–33.0)
MCHC: 32.3 g/dL (ref 32.0–36.0)
MCV: 94.1 fL (ref 80.0–100.0)
MPV: 11.3 fL (ref 7.5–12.5)
Platelets: 329 10*3/uL (ref 140–400)
RBC: 4.38 10*6/uL (ref 3.80–5.10)
RDW: 12.4 % (ref 11.0–15.0)
WBC: 5.6 10*3/uL (ref 3.8–10.8)

## 2021-07-08 LAB — HEMOGLOBIN A1C
Hgb A1c MFr Bld: 5.3 % of total Hgb (ref <5.7)
Mean Plasma Glucose: 105 mg/dL
eAG (mmol/L): 5.8 mmol/L

## 2021-07-08 LAB — LIPID PANEL
Cholesterol: 177 mg/dL (ref <200)
HDL: 64 mg/dL (ref >=50)
LDL Cholesterol (Calc): 94 mg/dL (calc)
Non-HDL Cholesterol (Calc): 113 mg/dL (calc) (ref <130)
Total CHOL/HDL Ratio: 2.8 (calc) (ref <5.0)
Triglycerides: 95 mg/dL (ref <150)

## 2021-07-08 LAB — CA 125: CA 125: 5 U/mL (ref <35)

## 2022-07-06 NOTE — Progress Notes (Signed)
62 y.o. [id number] Married White or Caucasian Not Hispanic or Latino female here for annual exam.  Not sexually active. No bowel or bladder issues.    H/O stage 1A ovarian cancer at 55, s/p TAH/BSO/Omentectomy/LND. S/p 3 rounds of chemo.    She is BRCA negative. Positive for HOXB13 VUS, may be linked to prostate cancer.  TC risk is 16-17%.   No LMP recorded. Patient has had a hysterectomy.          Sexually active: No.  The current method of family planning is status post hysterectomy.    Exercising: Yes.     Walking and stairs  Smoker:  no  Health Maintenance: Pap: unsure but prior to hysterectomy History of abnormal Pap:  no MMG:  07/08/20 density B Bi-rads 1 neg  BMD:   07/08/20 osteopenic,   t-score -2.0, previously had osteoporosis. On Fosamax in 11/19  Colonoscopy: 2016 normal f/u 10 years  TDaP:  05/01/19  Gardasil: n/a   reports that she has never smoked. She has never used smokeless tobacco. She reports that she does not drink alcohol and does not use drugs. 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 4 children, 2 girls, 2 boys. All married, has 7 living grandchildren (few days-70 years old). Everyone lives within 2.5 hours   Past Medical History:  Diagnosis Date   Kidney stones    Osteoporosis    OVARIAN CA dx' d7/2013   Prediabetes     Past Surgical History:  Procedure Laterality Date   ABDOMINAL HYSTERECTOMY  09/20/2011   Procedure: HYSTERECTOMY ABDOMINAL;  Surgeon: Janie Morning, MD PHD;  Location: [facility];  Service: Gynecology;  Laterality: N/A;  Total   APPENDECTOMY     BRAIN SURGERY  1996   Due to brain injury   [place]  09/20/2011   Procedure: EXPLORATORY LAPAROTOMY;  Surgeon: Janie Morning, MD PHD;  Location: [facility];  Service: Gynecology;  Laterality: N/A;   SALPINGOOPHORECTOMY  09/20/2011   Procedure: SALPINGO OOPHERECTOMY;  Surgeon: Janie Morning, MD PHD;  Location: [facility];  Service: Gynecology;   Laterality: Bilateral;    Current Outpatient Medications  Medication Sig Dispense Refill   Bioflavonoid Products (ESTER-C) 500-550 MG TABS Take 1 tablet by mouth daily.     Calcium Carbonate-Vitamin D (CALCIUM + D PO) Take 1 tablet by mouth daily.     Cholecalciferol (VITAMIN D-3) 1000 UNITS CAPS Take 1 capsule by mouth 2 (two) times daily.     Multiple Vitamins-Minerals (MULTIVITAMIN WITH MINERALS) tablet Take 1 tablet by mouth every morning.      Probiotic Product (PRO-BIOTIC BLEND) CAPS Take 1 tablet by mouth daily.     alendronate (FOSAMAX) 70 MG tablet Take with a full glass of water on an empty stomach. 12 tablet 3   No current facility-administered medications for this visit.    Family History  Problem Relation Age of Onset   Breast cancer Mother 103   Diabetes Sister    Breast cancer Maternal Aunt        diagnosed over 65; had 5 breast primaries   Breast cancer Maternal Aunt        over age 46; maternal half sister   Heart disease Father    Diabetes Father     Review of Systems  All other systems reviewed and are negative.   Exam:   BP 122/78   Pulse 66   Ht '5\' 5"'$  (1.651 m)  Wt 185 lb (83.9 kg)   SpO2 100%   BMI 30.79 kg/m   Weight change: '@WEIGHTCHANGE'$ @ Height:   Height: '5\' 5"'$  (165.1 cm)  Ht Readings from Last 3 Encounters:  07/13/22 '5\' 5"'$  (1.651 m)  07/07/21 '5\' 7"'$  (1.702 m)  06/12/20 '5\' 6"'$  (1.676 m)    General appearance: alert, cooperative and appears stated age Head: Normocephalic, without obvious abnormality, atraumatic Neck: no adenopathy, supple, symmetrical, trachea midline and thyroid normal to inspection and palpation Lungs: clear to auscultation bilaterally Cardiovascular: regular rate and rhythm Breasts: normal appearance, no masses or tenderness Abdomen: soft, non-tender; non distended,  no masses,  no organomegaly Extremities: extremities normal, atraumatic, no cyanosis or edema Skin: Skin color, texture, turgor normal. No rashes or  lesions Lymph nodes: Cervical, supraclavicular, and axillary nodes normal. No abnormal inguinal nodes palpated Neurologic: Grossly normal   Pelvic: External genitalia:  no lesions              Urethra:  normal appearing urethra with no masses, tenderness or lesions              Bartholins and Skenes: normal                 Vagina: atrophic appearing vagina with normal color and discharge, no lesions              Cervix: absent               Bimanual Exam:  Uterus:  uterus absent              Adnexa: no mass, fullness, tenderness               Rectovaginal: Confirms               Anus:  normal sphincter tone, no lesions  Gae Dry, CMa chaperoned for the exam.  1. Well woman exam Discussed breast self exam No pap needed Mammogram due, she will schedule  2. History of ovarian cancer - CA 125  3. Prediabetes - Hemoglobin A1c  4. History of osteoporosis Discussed calcium and vit D intake - DG Bone Density; Future - alendronate (FOSAMAX) 70 MG tablet; Take with a full glass of water on an empty stomach.  Dispense: 12 tablet; Refill: 3

## 2022-07-09 ENCOUNTER — Other Ambulatory Visit: Payer: Self-pay | Admitting: Obstetrics and Gynecology

## 2022-07-09 DIAGNOSIS — M810 Age-related osteoporosis without current pathological fracture: Secondary | ICD-10-CM

## 2022-07-11 NOTE — Telephone Encounter (Signed)
Med refill request: Fosamax 70 mg tab  Last AEX: 07/07/21 -JJ Next AEX: 07/13/22 Last MMG (if hormonal med) N/A   Call placed to patient. Left detailed message, ok per dpr.  AEX scheduled for 07/13/22. If RX needed prior to AEX, return call to office to advise. Keep AEX as scheduled for refills.   Rx refused.   Routing to provider for final review.  Will close encounter.

## 2022-07-13 ENCOUNTER — Ambulatory Visit (INDEPENDENT_AMBULATORY_CARE_PROVIDER_SITE_OTHER): Payer: Managed Care, Other (non HMO) | Admitting: Obstetrics and Gynecology

## 2022-07-13 ENCOUNTER — Encounter: Payer: Self-pay | Admitting: Obstetrics and Gynecology

## 2022-07-13 VITALS — BP 122/78 | HR 66 | Ht 65.0 in | Wt 185.0 lb

## 2022-07-13 DIAGNOSIS — Z01419 Encounter for gynecological examination (general) (routine) without abnormal findings: Secondary | ICD-10-CM

## 2022-07-13 DIAGNOSIS — Z8739 Personal history of other diseases of the musculoskeletal system and connective tissue: Secondary | ICD-10-CM | POA: Diagnosis not present

## 2022-07-13 DIAGNOSIS — R7303 Prediabetes: Secondary | ICD-10-CM | POA: Diagnosis not present

## 2022-07-13 DIAGNOSIS — Z8543 Personal history of malignant neoplasm of ovary: Secondary | ICD-10-CM

## 2022-07-13 MED ORDER — ALENDRONATE SODIUM 70 MG PO TABS: ORAL_TABLET | ORAL | 3 refills | Status: AC

## 2022-07-13 NOTE — Patient Instructions (Signed)

## 2022-07-14 LAB — CA 125: CA 125: 6 U/mL (ref <35)

## 2022-07-14 LAB — HEMOGLOBIN A1C
Hgb A1c MFr Bld: 5.3 % of total Hgb (ref <5.7)
Mean Plasma Glucose: 105 mg/dL
eAG (mmol/L): 5.8 mmol/L

## 2022-07-25 ENCOUNTER — Ambulatory Visit (HOSPITAL_COMMUNITY): Payer: Managed Care, Other (non HMO)

## 2022-07-25 ENCOUNTER — Other Ambulatory Visit (HOSPITAL_COMMUNITY): Payer: Self-pay | Admitting: Obstetrics and Gynecology

## 2022-07-25 DIAGNOSIS — Z1231 Encounter for screening mammogram for malignant neoplasm of breast: Secondary | ICD-10-CM

## 2022-08-03 ENCOUNTER — Ambulatory Visit (HOSPITAL_COMMUNITY)
Admission: RE | Admit: 2022-08-03 | Discharge: 2022-08-03 | Disposition: A | Discharge status: Home / Self Care | Payer: Managed Care, Other (non HMO) | Source: Ambulatory Visit | Attending: Obstetrics and Gynecology | Admitting: Obstetrics and Gynecology

## 2022-08-03 DIAGNOSIS — Z1231 Encounter for screening mammogram for malignant neoplasm of breast: Secondary | ICD-10-CM | POA: Diagnosis not present

## 2022-08-03 DIAGNOSIS — Z8739 Personal history of other diseases of the musculoskeletal system and connective tissue: Secondary | ICD-10-CM | POA: Diagnosis not present
